# Patient Record
Sex: Male | Born: 1985 | Race: White | Hispanic: No | Marital: Single | Smoking: Never smoker
Health system: Southern US, Community
[De-identification: ages and names within clinical notes are randomized; demographics above are authoritative.]

## PROBLEM LIST (undated history)

## (undated) DIAGNOSIS — R7989 Other specified abnormal findings of blood chemistry: Secondary | ICD-10-CM

## (undated) DIAGNOSIS — E669 Obesity, unspecified: Secondary | ICD-10-CM

## (undated) DIAGNOSIS — I10 Essential (primary) hypertension: Secondary | ICD-10-CM

## (undated) DIAGNOSIS — W3400XA Accidental discharge from unspecified firearms or gun, initial encounter: Secondary | ICD-10-CM

## (undated) DIAGNOSIS — G4730 Sleep apnea, unspecified: Secondary | ICD-10-CM

## (undated) DIAGNOSIS — E7800 Pure hypercholesterolemia, unspecified: Secondary | ICD-10-CM

## (undated) HISTORY — PX: LEG SURGERY: SHX1003

## (undated) HISTORY — PX: FOOT SURGERY: SHX648

## (undated) HISTORY — DX: Obesity, unspecified: E66.9

## (undated) HISTORY — DX: Sleep apnea, unspecified: G47.30

## (undated) HISTORY — DX: Accidental discharge from unspecified firearms or gun, initial encounter: W34.00XA

## (undated) HISTORY — DX: Essential (primary) hypertension: I10

## (undated) HISTORY — DX: Pure hypercholesterolemia, unspecified: E78.00

## (undated) HISTORY — DX: Other specified abnormal findings of blood chemistry: R79.89

---
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
## 2005-07-16 ENCOUNTER — Ambulatory Visit: Payer: Self-pay | Admitting: Family Medicine

## 2005-12-11 ENCOUNTER — Emergency Department: Payer: Self-pay | Admitting: General Practice

## 2005-12-13 ENCOUNTER — Ambulatory Visit: Payer: Self-pay | Admitting: Unknown Physician Specialty

## 2007-11-03 ENCOUNTER — Emergency Department (HOSPITAL_COMMUNITY): Admission: EM | Admit: 2007-11-03 | Discharge: 2007-11-03 | Payer: Self-pay | Admitting: Emergency Medicine

## 2008-04-29 ENCOUNTER — Ambulatory Visit: Payer: Self-pay | Admitting: General Practice

## 2011-08-30 LAB — BASIC METABOLIC PANEL
BUN: 9
CO2: 29
Calcium: 9.5
Chloride: 101
Creatinine, Ser: 0.72
GFR calc Af Amer: >60
GFR calc non Af Amer: >60
Glucose, Bld: 106 — ABNORMAL HIGH
Potassium: 3.6
Sodium: 135

## 2011-08-30 LAB — CBC
HCT: 43.1
Hemoglobin: 14.9
MCHC: 34.6
MCV: 89.9
Platelets: 263
RBC: 4.79
RDW: 12.1
WBC: 12.7 — ABNORMAL HIGH

## 2011-08-30 LAB — DIFFERENTIAL
Basophils Absolute: 0
Basophils Relative: 0
Eosinophils Absolute: 0 — ABNORMAL LOW
Eosinophils Relative: 0
Lymphocytes Relative: 8 — ABNORMAL LOW
Lymphs Abs: 1.1
Monocytes Absolute: 0.5
Monocytes Relative: 4
Neutro Abs: 11.1 — ABNORMAL HIGH
Neutrophils Relative %: 88 — ABNORMAL HIGH

## 2013-07-07 ENCOUNTER — Emergency Department: Payer: Self-pay | Admitting: General Practice

## 2013-08-04 ENCOUNTER — Emergency Department: Payer: Self-pay | Admitting: Emergency Medicine

## 2013-10-26 ENCOUNTER — Emergency Department: Payer: Self-pay | Admitting: Emergency Medicine

## 2014-08-17 ENCOUNTER — Emergency Department: Payer: Self-pay | Admitting: Emergency Medicine

## 2014-08-30 ENCOUNTER — Ambulatory Visit: Payer: Self-pay | Admitting: General Practice

## 2017-03-12 ENCOUNTER — Emergency Department
Admission: EM | Admit: 2017-03-12 | Discharge: 2017-03-12 | Disposition: A | Discharge status: Home / Self Care | Payer: BLUE CROSS/BLUE SHIELD | Attending: Emergency Medicine | Admitting: Emergency Medicine

## 2017-03-12 ENCOUNTER — Encounter: Payer: Self-pay | Admitting: Medical Oncology

## 2017-03-12 DIAGNOSIS — J029 Acute pharyngitis, unspecified: Secondary | ICD-10-CM | POA: Insufficient documentation

## 2017-03-12 DIAGNOSIS — R69 Illness, unspecified: Secondary | ICD-10-CM

## 2017-03-12 DIAGNOSIS — R05 Cough: Secondary | ICD-10-CM | POA: Insufficient documentation

## 2017-03-12 DIAGNOSIS — J111 Influenza due to unidentified influenza virus with other respiratory manifestations: Secondary | ICD-10-CM

## 2017-03-12 MED ORDER — GUAIFENESIN-CODEINE 100-10 MG/5ML PO SYRP: 5.0000 mL | ORAL_SOLUTION | Freq: Three times a day (TID) | ORAL | 0 refills | Status: DC | PRN

## 2017-03-12 MED ORDER — OSELTAMIVIR PHOSPHATE 75 MG PO CAPS: 75.0000 mg | ORAL_CAPSULE | Freq: Two times a day (BID) | ORAL | 0 refills | Status: AC

## 2017-03-12 NOTE — ED Provider Notes (Signed)
[facility] Emergency Department Provider Note  ____________________________________________  Time seen: Approximately 7:45 PM  I have reviewed the triage vital signs and the nursing notes.   HISTORY  Chief Complaint Influenza   HPI William Simpson is a 31 y.o. male who presents to the emergency department for evaluation of flu-like symptoms that startedtoday. His father tested positive for influenza 4-5 days ago. Patient states he is here for "tamiflu." He has not taken any over the counter medications for his sore throat, cough and body aches.   History reviewed. No pertinent past medical history.  There are no active problems to display for this patient.   No past surgical history on file.  Prior to Admission medications   Medication Sig Start Date End Date Taking? Authorizing Provider  guaiFENesin-codeine (ROBITUSSIN AC) 100-10 MG/5ML syrup Take 5 mLs by mouth 3 (three) times daily as needed for cough. 03/12/17   Chinita Pester, FNP  oseltamivir (TAMIFLU) 75 MG capsule Take 1 capsule (75 mg total) by mouth 2 (two) times daily. 03/12/17 03/17/17  Chinita Pester, FNP    Allergies Sulfa antibiotics  No family history on file.  Social History Social History  Substance Use Topics  . Smoking status: Not on file  . Smokeless tobacco: Not on file  . Alcohol use Not on file    Review of Systems Constitutional: Negative for fever/ positive for chills ENT: Positive for sore throat. Cardiovascular: Denies chest pain. Respiratory: Negative for shortness of breath. Positive for cough. Gastrointestinal: Negative for nausea,  no vomiting.  No diarrhea.  Musculoskeletal: Positive for body aches Skin: Negative for rash. Neurological: Negative for headaches ____________________________________________   PHYSICAL EXAM:  VITAL SIGNS: ED Triage Vitals  Enc Vitals Group     BP 03/12/17 1826 135/80     Pulse Rate 03/12/17 1826 97     Resp  03/12/17 1826 20     Temp 03/12/17 1826 98.3 F (36.8 C)     Temp Source 03/12/17 1826 Oral     SpO2 03/12/17 1826 97 %     Weight 03/12/17 1825 280 lb (127 kg)     Height 03/12/17 1825  (1.854 m)     Head Circumference --      Peak Flow --      Pain Score 03/12/17 1825 2     Pain Loc --      Pain Edu? --      Excl. in GC? --     Constitutional: Alert and oriented. Acutely ill appearing and in no acute distress. Eyes: Conjunctivae are normal. EOMI. Ears: Bilateral TM normal Nose: Nasal congestion noted; clear rhinnorhea. Mouth/Throat: Mucous membranes are moist.  Oropharynx mildly erythematous. Tonsils without exudate. Neck: No stridor.  Lymphatic: No cervical lymphadenopathy. Cardiovascular: Normal rate, regular rhythm. Good peripheral circulation. Respiratory: Normal respiratory effort.  No retractions. Clear to auscultation throughout. . Gastrointestinal: Soft and nontender.  Musculoskeletal: FROM x 4 extremities.  Neurologic:  Normal speech and language.  Skin:  Skin is warm, dry and intact. No rash noted. Psychiatric: Mood and affect are normal. Speech and behavior are normal.  ____________________________________________   LABS (all labs ordered are listed, but only abnormal results are displayed)  Labs Reviewed - No data to display ____________________________________________  EKG  Not indicated. ____________________________________________  RADIOLOGY  Not indicated. ____________________________________________   PROCEDURES  Procedure(s) performed: None  Critical Care performed: No ____________________________________________   INITIAL IMPRESSION / ASSESSMENT AND PLAN / ED COURSE  31 year old  male presenting to the emergency department for treatment of influenza. Patient requests a prescription for Tamiflu. He will also be given a prescription for Robitussin-AC and advised to avoid contact with the public as much as possible for the next few  days. He is instructed to follow up with his primary care provider for symptoms that are not improving over the next few days. He was instructed to return to the emergency department for symptoms that change or worsen if he is unable to schedule an appointment.  Pertinent labs & imaging results that were available during my care of the patient were reviewed by me and considered in my medical decision making (see chart for details).  New Prescriptions   GUAIFENESIN-CODEINE (ROBITUSSIN AC) 100-10 MG/5ML SYRUP    Take 5 mLs by mouth 3 (three) times daily as needed for cough.   OSELTAMIVIR (TAMIFLU) 75 MG CAPSULE    Take 1 capsule (75 mg total) by mouth 2 (two) times daily.    If controlled substance prescribed during this visit, 12 month history viewed on the [facility] prior to issuing an initial prescription for Schedule II or III opiod. ____________________________________________   FINAL CLINICAL IMPRESSION(S) / ED DIAGNOSES  Final diagnoses:  Influenza-like illness    Note:  This document was prepared using Dragon voice recognition software and may include unintentional dictation errors.     Chinita Pester, FNP 03/12/17 2000    Sharman Cheek, MD 03/12/17 [phone]

## 2017-03-12 NOTE — ED Notes (Signed)
Pt states his father was diagnosed with "the flu" and pt states today he has begun to experience body aches. Pt states no fever. Pt states "last time I waited too long and they wouldn't give me theraflu." pt appears in no acute distress.

## 2017-03-12 NOTE — Discharge Instructions (Signed)
Follow up with the primary care provider for symptoms that are not improving over the next few days. ° °Return to the ER for symptoms that change or worsen if unable to schedule an appointment. °

## 2017-03-12 NOTE — ED Triage Notes (Signed)
Pts father was diagnosed with flu recently and pt today began having body aches and cough.

## 2017-04-04 ENCOUNTER — Ambulatory Visit
Admission: RE | Admit: 2017-04-04 | Discharge: 2017-04-04 | Disposition: A | Discharge status: Home / Self Care | Payer: BLUE CROSS/BLUE SHIELD | Source: Ambulatory Visit | Attending: Family Medicine | Admitting: Family Medicine

## 2017-04-04 ENCOUNTER — Other Ambulatory Visit: Payer: Self-pay | Admitting: Family Medicine

## 2017-04-04 DIAGNOSIS — R05 Cough: Secondary | ICD-10-CM | POA: Diagnosis not present

## 2017-04-04 DIAGNOSIS — R059 Cough, unspecified: Secondary | ICD-10-CM

## 2017-04-20 LAB — BASIC METABOLIC PANEL: Potassium: 4.4 (ref 3.4–5.3)

## 2017-04-20 LAB — LIPID PANEL
Cholesterol: 237 — AB (ref 0–200)
HDL: 60 (ref 35–70)
LDL Cholesterol: 152
Triglycerides: 127 (ref 40–160)

## 2017-04-20 LAB — HEPATIC FUNCTION PANEL
ALT: 93 — AB (ref 10–40)
AST: 44 — AB (ref 14–40)

## 2018-04-04 ENCOUNTER — Ambulatory Visit
Admission: RE | Admit: 2018-04-04 | Discharge: 2018-04-04 | Disposition: A | Discharge status: Home / Self Care | Payer: BLUE CROSS/BLUE SHIELD | Source: Ambulatory Visit | Attending: Family Medicine | Admitting: Family Medicine

## 2018-04-04 ENCOUNTER — Other Ambulatory Visit: Payer: Self-pay | Admitting: Family Medicine

## 2018-04-04 DIAGNOSIS — M79644 Pain in right finger(s): Secondary | ICD-10-CM | POA: Insufficient documentation

## 2018-04-04 DIAGNOSIS — X58XXXA Exposure to other specified factors, initial encounter: Secondary | ICD-10-CM | POA: Insufficient documentation

## 2018-04-04 DIAGNOSIS — S62521A Displaced fracture of distal phalanx of right thumb, initial encounter for closed fracture: Secondary | ICD-10-CM | POA: Insufficient documentation

## 2018-05-09 LAB — LIPID PANEL
Cholesterol: 223 — AB (ref 0–200)
HDL: 59 (ref 35–70)
LDL Cholesterol: 149
Triglycerides: 74 (ref 40–160)

## 2018-05-09 LAB — HEPATIC FUNCTION PANEL
ALT: 128 — AB (ref 10–40)
AST: 50 — AB (ref 14–40)

## 2018-05-09 LAB — BASIC METABOLIC PANEL: Glucose: 104

## 2018-11-29 ENCOUNTER — Emergency Department
Admission: EM | Admit: 2018-11-29 | Discharge: 2018-11-29 | Disposition: A | Discharge status: Home / Self Care | Payer: BLUE CROSS/BLUE SHIELD | Attending: Emergency Medicine | Admitting: Emergency Medicine

## 2018-11-29 ENCOUNTER — Encounter: Payer: Self-pay | Admitting: Emergency Medicine

## 2018-11-29 ENCOUNTER — Other Ambulatory Visit: Payer: Self-pay

## 2018-11-29 ENCOUNTER — Emergency Department: Payer: BLUE CROSS/BLUE SHIELD

## 2018-11-29 DIAGNOSIS — I10 Essential (primary) hypertension: Secondary | ICD-10-CM | POA: Diagnosis not present

## 2018-11-29 DIAGNOSIS — R079 Chest pain, unspecified: Secondary | ICD-10-CM

## 2018-11-29 LAB — BASIC METABOLIC PANEL
Anion gap: 8 (ref 5–15)
BUN: 7 mg/dL (ref 6–20)
CO2: 28 mmol/L (ref 22–32)
Calcium: 9.7 mg/dL (ref 8.9–10.3)
Chloride: 103 mmol/L (ref 98–111)
Creatinine, Ser: 0.81 mg/dL (ref 0.61–1.24)
GFR calc Af Amer: >60 mL/min (ref >60)
GLUCOSE: 97 mg/dL (ref 70–99)
POTASSIUM: 3.9 mmol/L (ref 3.5–5.1)
Sodium: 139 mmol/L (ref 135–145)

## 2018-11-29 LAB — CBC
HEMATOCRIT: 49.5 % (ref 39.0–52.0)
Hemoglobin: 17.1 g/dL — ABNORMAL HIGH (ref 13.0–17.0)
MCH: 33.3 pg (ref 26.0–34.0)
MCHC: 34.5 g/dL (ref 30.0–36.0)
MCV: 96.3 fL (ref 80.0–100.0)
Platelets: 224 10*3/uL (ref 150–400)
RBC: 5.14 MIL/uL (ref 4.22–5.81)
RDW: 12.4 % (ref 11.5–15.5)
WBC: 6.6 10*3/uL (ref 4.0–10.5)
nRBC: 0 % (ref 0.0–0.2)

## 2018-11-29 LAB — TROPONIN I

## 2018-11-29 NOTE — ED Triage Notes (Signed)
PT arrived with complaints of chest tightness that started 1 hour prior to arrival. Pt states he has had high blood pressure readings in the last 2 days but denies any diagnosis of HTN. Pt in NAD

## 2018-11-29 NOTE — ED Provider Notes (Addendum)
[facility] Emergency Department Provider Note  ____________________________________________   First MD Initiated Contact with Patient 11/29/18 1422     (approximate)  I have reviewed the triage vital signs and the nursing notes.   HISTORY  Chief Complaint Chest Pain   HPI William Simpson is a 33 y.o. male having chronic medical conditions he says that he has been having chest as well as facial pressure over the past several weeks.  He says that he had this sensation for approximately 8 hours yesterday from 1 PM until 9 PM.  States that at this time he is feeling improved.  However, an hour before arrival to the emergency department today said that he had the same pressure-like sensation and then went to his primary care doctor's office who found his blood pressure to be highly elevated in the 160s to 180s over 1 teens.  He says that he had also taken his blood pressure drugstore over the past 24 hours and it was elevated to a similar range as well.  Patient denies any shortness of breath, nausea or vomiting or abnormal diaphoresis.  He says that he is a "sweaty person."  Says that he has a family history of high blood pressure but denies any family history of heart disease.  Says that he drinks about 4-5 drinks a day of beer and liquor and that he has lost weight over the past several months.  Says that the symptoms do not worsen with exertion.  Denies any recent stressful events.    History reviewed. No pertinent past medical history.  There are no active problems to display for this patient.   History reviewed. No pertinent surgical history.  Prior to Admission medications   Medication Sig Start Date End Date Taking? Authorizing Provider  guaiFENesin-codeine (ROBITUSSIN AC) 100-10 MG/5ML syrup Take 5 mLs by mouth 3 (three) times daily as needed for cough. 03/12/17   Triplett, Rulon Eisenmenger B, FNP    Allergies Amoxicillin and Sulfa antibiotics  No family  history on file.  Social History Social History   Tobacco Use  . Smoking status: Never Smoker  . Smokeless tobacco: Current User  Substance Use Topics  . Alcohol use: Yes  . Drug use: Not on file    Review of Systems  Constitutional: No fever/chills Eyes: No visual changes. ENT: No sore throat. Cardiovascular: Denies "chest pain."  However, does report "chest pressure." Respiratory: Denies shortness of breath. Gastrointestinal: No abdominal pain.  No nausea, no vomiting.  No diarrhea.  No constipation. Genitourinary: Negative for dysuria. Musculoskeletal: Negative for back pain. Skin: Negative for rash. Neurological: Negative for headaches, focal weakness or numbness.   ____________________________________________   PHYSICAL EXAM:  VITAL SIGNS: ED Triage Vitals  Enc Vitals Group     BP 11/29/18 1351 (!) 142/89     Pulse Rate 11/29/18 1351 81     Resp 11/29/18 1351 18     Temp 11/29/18 1351 98.1 F (36.7 C)     Temp Source 11/29/18 1351 Oral     SpO2 11/29/18 1351 96 %     Weight 11/29/18 1354 280 lb (127 kg)     Height 11/29/18 1354 6\' 1"  (1.854 m)     Head Circumference --      Peak Flow --      Pain Score 11/29/18 1354 3     Pain Loc --      Pain Edu? --      Excl. in GC? --  Constitutional: Alert and oriented. Well appearing and in no acute distress. Eyes: Conjunctivae are normal.  Head: Atraumatic. Nose: No congestion/rhinnorhea. Mouth/Throat: Mucous membranes are moist.  Neck: No stridor.   Cardiovascular: Normal rate, regular rhythm. Grossly normal heart sounds.   Respiratory: Normal respiratory effort.  No retractions. Lungs CTAB. Gastrointestinal: Soft and nontender. No distention. Musculoskeletal: No lower extremity tenderness nor edema.  No joint effusions. Neurologic:  Normal speech and language. No gross focal neurologic deficits are appreciated. Skin:  Skin is warm, dry and intact. No rash noted. Psychiatric: Mood and affect are normal.  Speech and behavior are normal.  ____________________________________________   LABS (all labs ordered are listed, but only abnormal results are displayed)  Labs Reviewed  CBC - Abnormal; Notable for the following components:      Result Value   Hemoglobin 17.1 (*)    All other components within normal limits  BASIC METABOLIC PANEL  TROPONIN I   ____________________________________________  EKG  ED ECG REPORT I, Arelia Longest, the attending physician, personally viewed and interpreted this ECG.   Date: 11/29/2018  EKG Time: 1348  Rate: 80  Rhythm: normal sinus rhythm  Axis: Normal  Intervals:none  ST&T Change: No ST segment elevation or depression.  No abnormal T wave inversion.  ____________________________________________  RADIOLOGY  Patient refuses chest x-ray ____________________________________________   PROCEDURES  Procedure(s) performed:   Procedures  Critical Care performed:   ____________________________________________   INITIAL IMPRESSION / ASSESSMENT AND PLAN / ED COURSE  Pertinent labs & imaging results that were available during my care of the patient were reviewed by me and considered in my medical decision making (see chart for details).  Differential diagnosis includes, but is not limited to, ACS, aortic dissection, pulmonary embolism, cardiac tamponade, pneumothorax, pneumonia, pericarditis, myocarditis, GI-related causes including esophagitis/gastritis, and musculoskeletal chest wall pain.   As part of my medical decision making, I reviewed the following data within the electronic MEDICAL RECORD NUMBER Notes from prior ED visits  Heart score of 1.  Patient will be discharged at this time.  Will refer to cardiology.  He says that he will be returned to his primary care doctors of the next 2 days for blood pressure checks.  Blood pressure trending downward with pressure now in the 140s over 60s.  Patient asymptomatic.  Will be discharged.  He  is understanding the diagnosis well treatment and willing to comply but we also discussed reducing his alcohol intake as well as continuing to lose weight as this will be helpful for his overall health.  I will not be starting an antihypertensive meds at this time as the patient says that his blood pressure is only been elevated over the past 24 hours and then normally he has 120s over 80s.  Appears to be trending downward back towards normal at this time.  He will be following up for further blood pressure checks with his primary care doctor.  ____________________________________________   FINAL CLINICAL IMPRESSION(S) / ED DIAGNOSES  Chest pain.  Hypertension.   NEW MEDICATIONS STARTED DURING THIS VISIT:  New Prescriptions   No medications on file     Note:  This document was prepared using Dragon voice recognition software and may include unintentional dictation errors.     Myrna Blazer, MD 11/29/18 1547    Pershing Proud Myra Rude, MD 11/29/18 [phone]

## 2019-04-07 IMAGING — CR DG CHEST 2V
1 series · 2 of 2 positions shown · non-contrast
Comparison: October 27, 2013

CLINICAL DATA: Cough and congestion for 2 months. Fever for 3 days.

EXAM:
CHEST  2 VIEW

[Series 1: dg chest 2 view · 0.14mm/px · 2 of 2 slices shown]
[im 1/2]
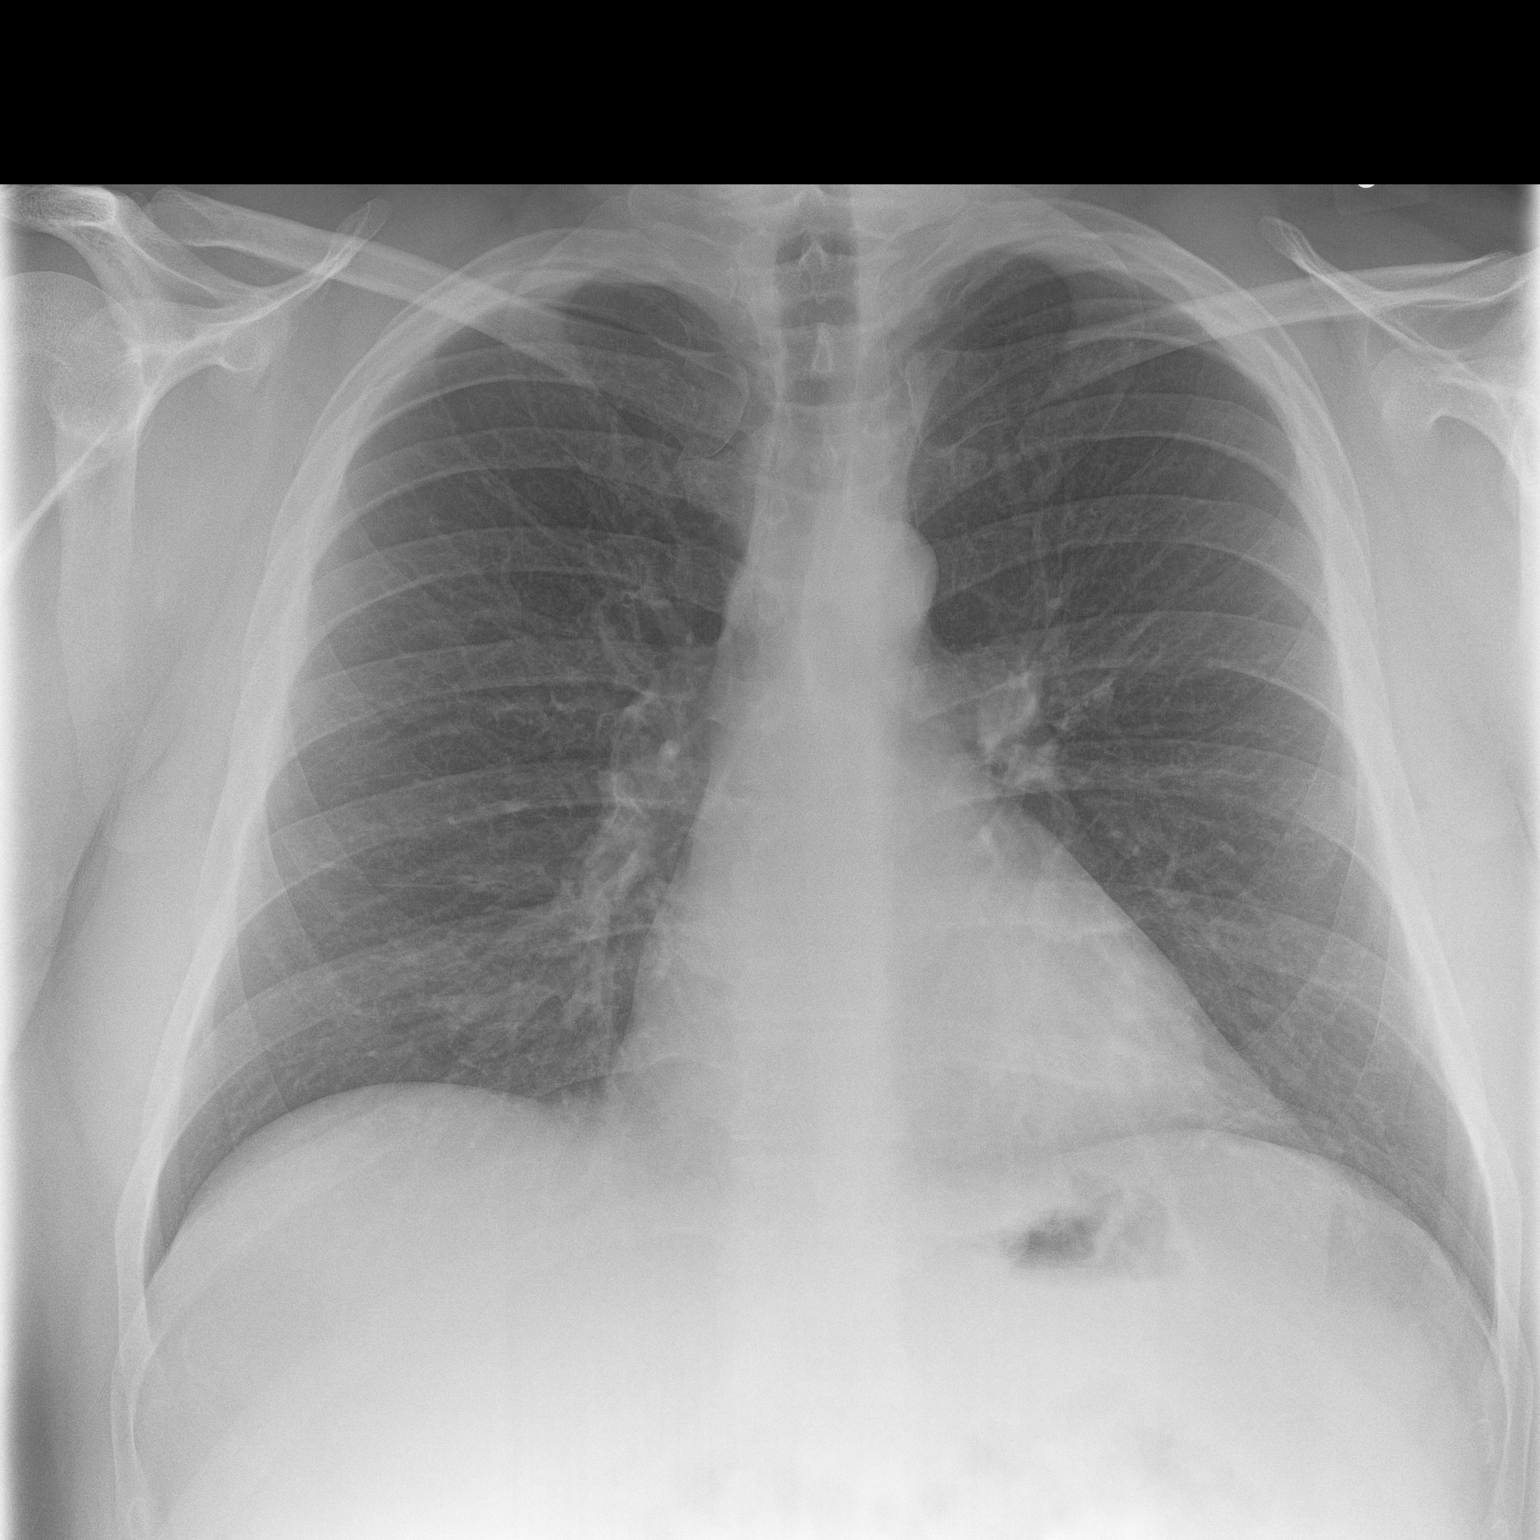
[im 2/2]
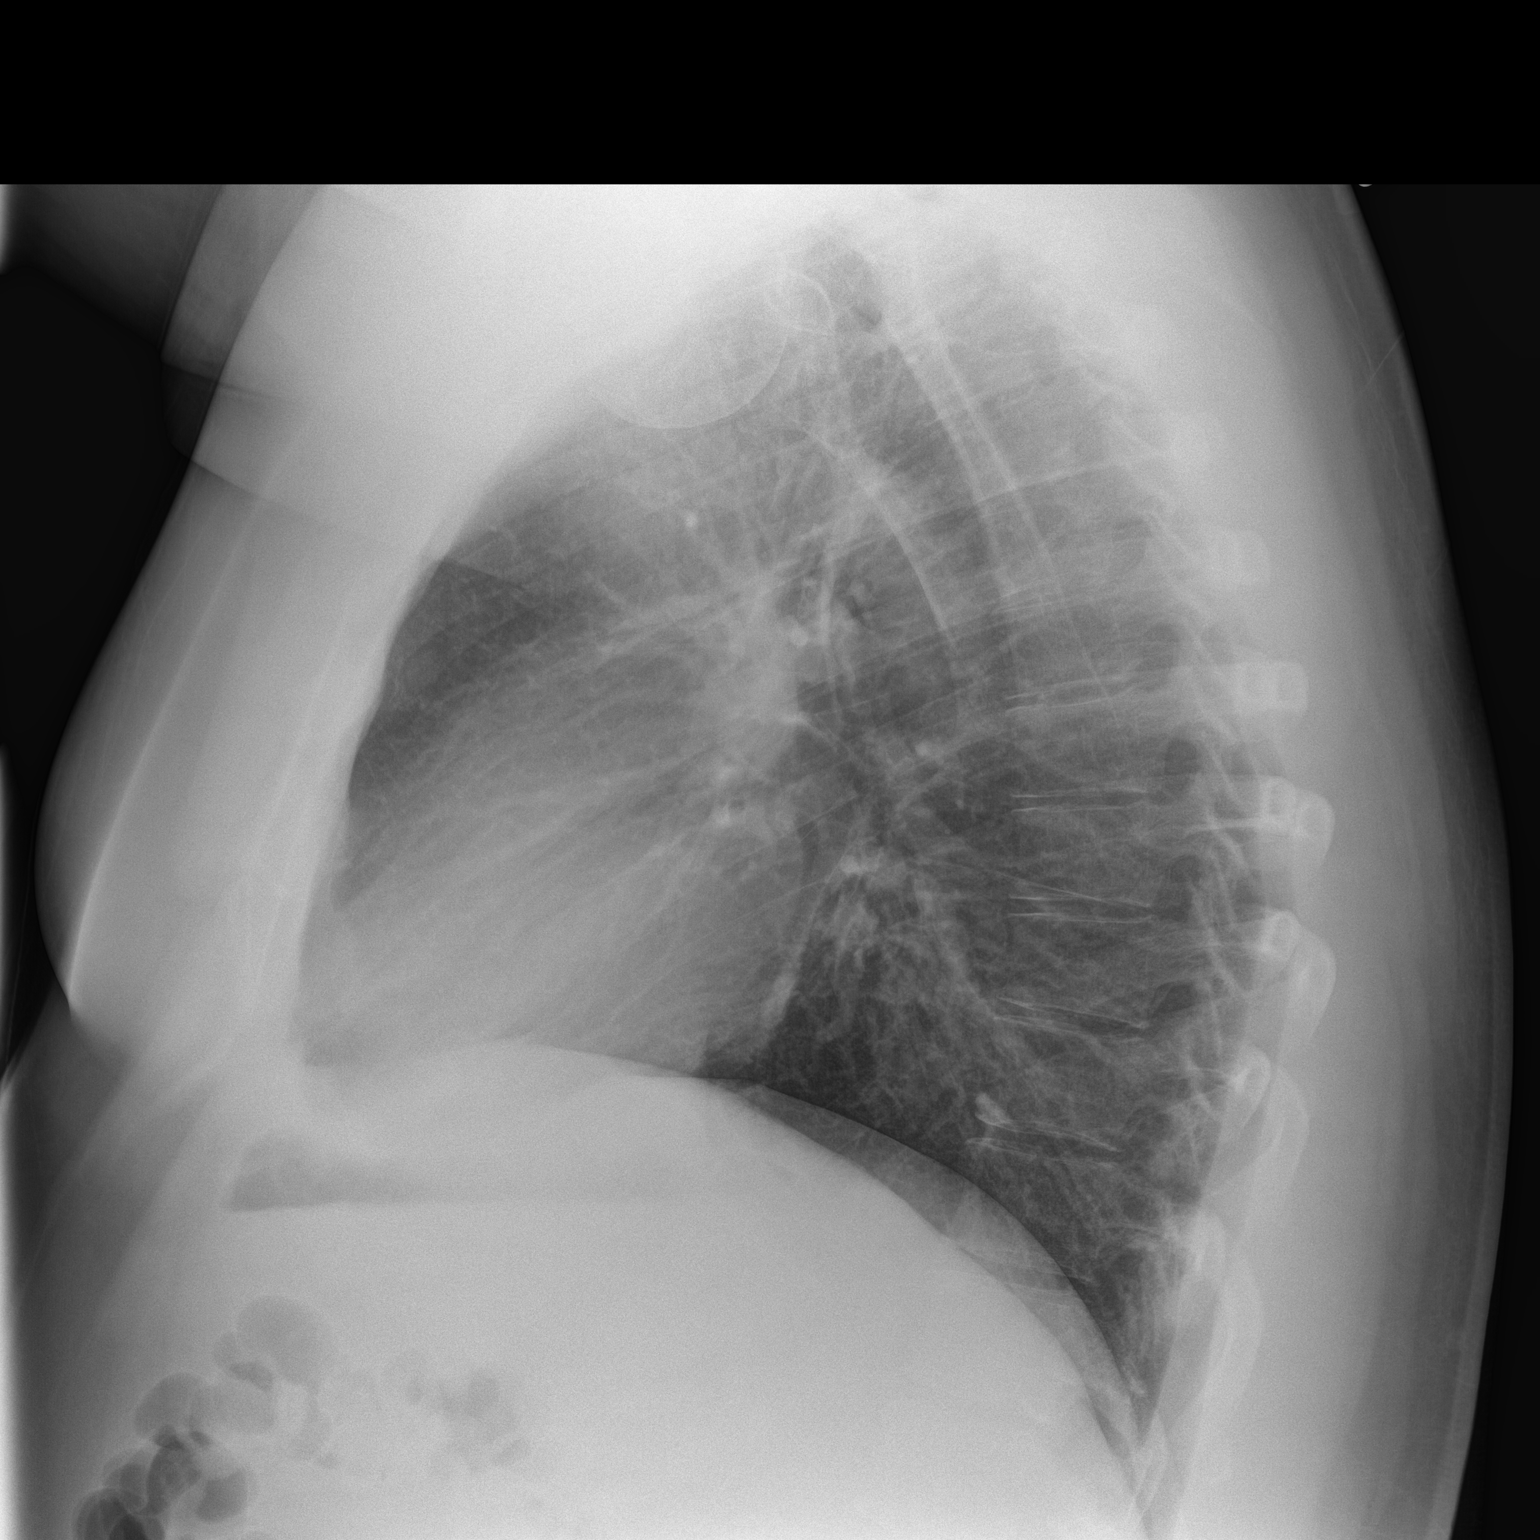

[2 of 2 positions shown; findings below may reference images not displayed]

FINDINGS: Lungs are clear. Heart size and pulmonary vascularity are normal. No
adenopathy. No bone lesions.
IMPRESSION: No edema or consolidation.

## 2019-05-10 ENCOUNTER — Telehealth: Payer: Self-pay

## 2019-05-10 NOTE — Telephone Encounter (Signed)
Spoke with pt advised needs to be seen and is due for physical he wants to be seen for the shoulder and back pain to discuss getting a rx for a massage. Ov made

## 2019-05-10 NOTE — Telephone Encounter (Signed)
Please let patient know I cannot write that for him as we have not seen him for any such diagnosis recently, with last visit last June.  Thanks,  [facility]

## 2019-05-11 DIAGNOSIS — Z720 Tobacco use: Secondary | ICD-10-CM | POA: Insufficient documentation

## 2019-05-11 LAB — ALKALINE PHOSPHATASE
Alkaline Phosphatase: 35
Alkaline Phosphatase: 36

## 2019-05-11 LAB — BUN/CREATININE RATIO: BUN/Creatinine Ratio: 7

## 2019-05-14 ENCOUNTER — Ambulatory Visit: Payer: Self-pay | Admitting: Internal Medicine

## 2019-05-14 ENCOUNTER — Encounter: Payer: Self-pay | Admitting: Internal Medicine

## 2019-05-14 ENCOUNTER — Other Ambulatory Visit: Payer: Self-pay

## 2019-05-14 VITALS — BP 148/100 | HR 96 | Temp 98.0°F | Resp 14 | Ht 73.0 in | Wt 297.0 lb

## 2019-05-14 DIAGNOSIS — M7541 Impingement syndrome of right shoulder: Secondary | ICD-10-CM

## 2019-05-14 DIAGNOSIS — E6609 Other obesity due to excess calories: Secondary | ICD-10-CM

## 2019-05-14 DIAGNOSIS — M25511 Pain in right shoulder: Secondary | ICD-10-CM

## 2019-05-14 DIAGNOSIS — S46011A Strain of muscle(s) and tendon(s) of the rotator cuff of right shoulder, initial encounter: Secondary | ICD-10-CM

## 2019-05-14 DIAGNOSIS — Z6839 Body mass index (BMI) 39.0-39.9, adult: Secondary | ICD-10-CM

## 2019-05-14 DIAGNOSIS — R030 Elevated blood-pressure reading, without diagnosis of hypertension: Secondary | ICD-10-CM

## 2019-05-14 NOTE — Progress Notes (Signed)
S - Presents as requested a script for massage therapy for back and shoulder issues as he needs one for insurance purposes. He has a prepaid flex card and that will be covered if has a script. He obtained one in the past and noted was for stress relief.  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 No one time trauma could recall that caused pain. No h/o shoulder injuries Not ice, did apply heat some Tried OTC ibuprofen sparingly Has not limited him with work, is in charge of an investigative unit, and thought would get checked while here today His wife is a PT  Denies any h/o back pain concerns  His BP has been high in the past intermittently, never been on meds to manage, can get checked at home as has a cuff to do so and has been checking periodically at home in past weeks and noted has not been elevated (DBP <90).   No tob history  O - NAD, obese, masked  BP (!) 148/100 (BP Location: Right Arm, Patient Position: Sitting, Cuff Size: Large)   Pulse 96   Temp 98 F (36.7 C) (Oral)   Resp 14   Ht 6\' 1"  (1.854 m)   Wt 297 lb (134.7 kg)   SpO2 97%   BMI 39.18 kg/m    Sclera anicteric R Shoulder - ROM testing - abduction limited above shoulder level due to pain and could get to full  abduction  No painful arc  Forward elevation not limited and much easier than abduction  Apley's negative  NT with palpation at A-C joint  NT anterior joint line, NT posterior joint line  Tender at subacromial bursa region  Apprehension sign neg  Impingement test pos  RC testing - supraspinatus  with slightly less strength right vs left, and no minimal pain  testing    Lift-off test with good strength, +  Pain with testing    Internal rotation with good strength and no pain    External rotation with good strength and mild pain with testing  NT bicipital tendon  anterior  Sensation intact deltoid and in UE diffusely to LT  Good UE strength including good grip strength  No radicular signs in UE Good shoulder shrug Neck - FROM, no pain with ROM testing       Ass/Plan  1. RC strain/+ impingement - R shoulder  Ice liberally short term and not heat   Can use ibuprofen - up to 600mg  4X/day prn with food   Not feel x-ray helpful presently and explained why and discussed PT and RC strengthening program and he noted his wife is a PT and she can help with that  Stay active below shoulder level, not above and minimize any heavy lifting until much improved   2. Increased BP - concern not as well controlled in recent past, did note gets checked at times and has been reasonable   Will check BP's about every other day and record and f/u in 3-4 weeks to review.   Weight may be a factor with BP as well  3. Obesity - BMI almost 40, weight control/loss will be important to help with BP control over time as well  4. Massage request as above - explained not for shoulder, and he does not really have a defined medical indication for massage therapy. I wrote  on a prescription massage therapy without a diagnosis as massage can be helpful in my opinion at times related to stress issues, and muscle strains/spasms.

## 2019-05-23 ENCOUNTER — Telehealth: Payer: Self-pay

## 2019-05-23 DIAGNOSIS — Z03818 Encounter for observation for suspected exposure to other biological agents ruled out: Secondary | ICD-10-CM | POA: Diagnosis not present

## 2019-05-23 NOTE — Telephone Encounter (Signed)
Pt aware and work note emailed to [employer] at [employer]. Pt picking up order for [facility] clinic covid testing

## 2019-05-23 NOTE — Telephone Encounter (Signed)
  1. Do you have a fever? no 2. Are you having chills? no 3. Do you have a sore throat? yes 4. Are you experiencing resp S/Sx -cough, SOB? Yes Cough started yesterday 5. Do you have muscle aches? no 6. Are you experiencing N/V/D? no 7. Experiencing loss of sense of taste and/or smell? no 8. Do you have a headache? no 9. Have you had contact with a person confirmed Positive for Covid-19? no 10. Have you traveled outside of [place]? No  Cough started yesterday now sore throat, no other sx. Works Youth worker at work now, isolated in an office at [place]. Lives with wife and kids none have symptoms. Not taking any medications, fever reducers.

## 2019-05-23 NOTE — Telephone Encounter (Signed)
Please have him go to [facility] for testing, and go home. Stay out of work as Designer, television/film set and treat symptomatically at present. Will touch base by phone tomorrow as well before the holiday weekend. Stay isolated from those at home as well rec'ed.

## 2019-05-28 NOTE — Telephone Encounter (Signed)
Pt states test came back neg on 05/25/2019. Last ran fever on 05/24/2019, no fever reducers and back at work today. Still does have a cough pt relating to allergies.

## 2019-05-28 NOTE — Telephone Encounter (Signed)
Thanks for the followup!  

## 2019-07-13 DIAGNOSIS — Z20828 Contact with and (suspected) exposure to other viral communicable diseases: Secondary | ICD-10-CM | POA: Diagnosis not present

## 2019-09-30 DIAGNOSIS — Z20828 Contact with and (suspected) exposure to other viral communicable diseases: Secondary | ICD-10-CM | POA: Diagnosis not present

## 2019-12-14 DIAGNOSIS — Z20828 Contact with and (suspected) exposure to other viral communicable diseases: Secondary | ICD-10-CM | POA: Diagnosis not present

## 2020-01-31 DIAGNOSIS — G4733 Obstructive sleep apnea (adult) (pediatric): Secondary | ICD-10-CM | POA: Diagnosis not present

## 2020-01-31 DIAGNOSIS — I10 Essential (primary) hypertension: Secondary | ICD-10-CM | POA: Diagnosis not present

## 2020-01-31 DIAGNOSIS — R55 Syncope and collapse: Secondary | ICD-10-CM | POA: Diagnosis not present

## 2020-02-06 DIAGNOSIS — G4733 Obstructive sleep apnea (adult) (pediatric): Secondary | ICD-10-CM | POA: Insufficient documentation

## 2020-02-06 DIAGNOSIS — R55 Syncope and collapse: Secondary | ICD-10-CM | POA: Insufficient documentation

## 2020-02-06 DIAGNOSIS — I10 Essential (primary) hypertension: Secondary | ICD-10-CM | POA: Insufficient documentation

## 2020-04-06 IMAGING — CR DG FINGER THUMB 2+V*R*
3 series · 3 of 3 positions shown · non-contrast
Comparison: None.

CLINICAL DATA: Thumb pain after crush injury

EXAM:
RIGHT THUMB 2+V

[finger ap]
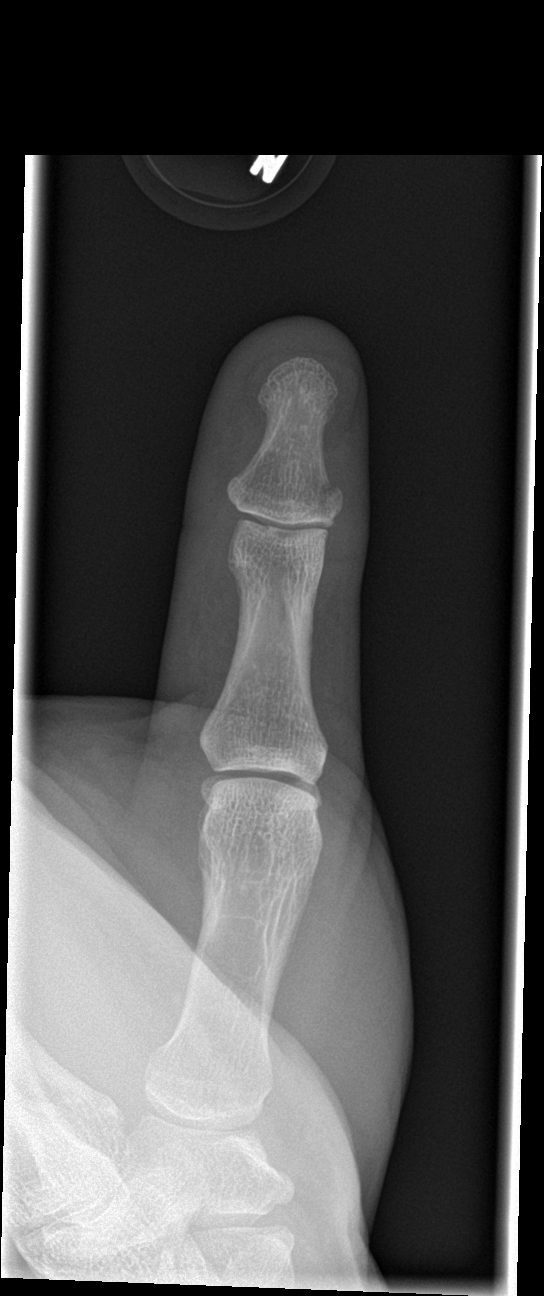

[finger obl]
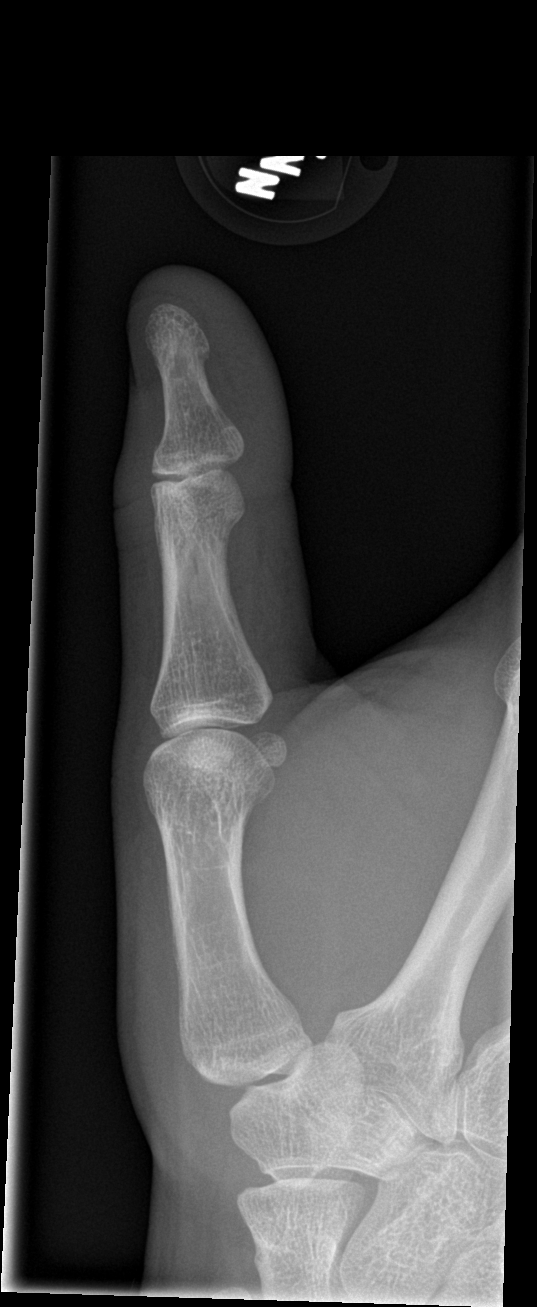

[finger lat]
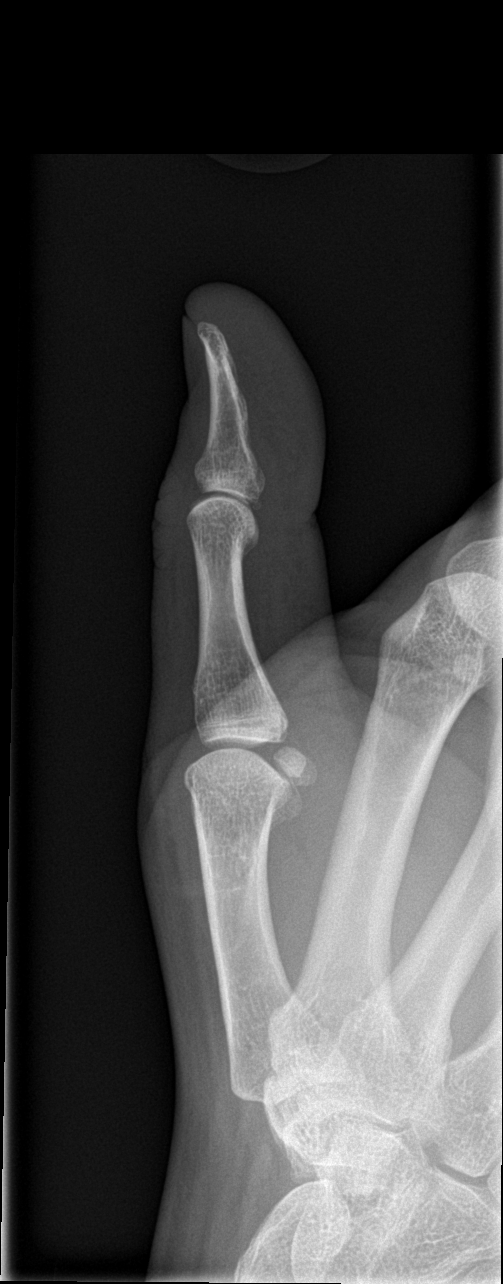

[3 of 3 positions shown; findings below may reference images not displayed]

FINDINGS: Acute to subacute appearing minimally displaced fracture base of the
first distal phalanx, volar aspect likely with articular extension.
No subluxation.
IMPRESSION: Acute to subacute minimally displaced fracture base of the first
distal phalanx

## 2020-04-09 DIAGNOSIS — Q6689 Other  specified congenital deformities of feet: Secondary | ICD-10-CM | POA: Diagnosis not present

## 2020-04-09 DIAGNOSIS — M2141 Flat foot [pes planus] (acquired), right foot: Secondary | ICD-10-CM | POA: Diagnosis not present

## 2020-04-09 DIAGNOSIS — M19071 Primary osteoarthritis, right ankle and foot: Secondary | ICD-10-CM | POA: Diagnosis not present

## 2020-04-09 DIAGNOSIS — M25571 Pain in right ankle and joints of right foot: Secondary | ICD-10-CM | POA: Diagnosis not present

## 2020-04-23 DIAGNOSIS — M19071 Primary osteoarthritis, right ankle and foot: Secondary | ICD-10-CM | POA: Diagnosis not present

## 2020-04-23 DIAGNOSIS — Q6689 Other  specified congenital deformities of feet: Secondary | ICD-10-CM | POA: Diagnosis not present

## 2020-04-23 DIAGNOSIS — M216X1 Other acquired deformities of right foot: Secondary | ICD-10-CM | POA: Diagnosis not present

## 2020-04-23 DIAGNOSIS — M2141 Flat foot [pes planus] (acquired), right foot: Secondary | ICD-10-CM | POA: Diagnosis not present

## 2020-04-23 DIAGNOSIS — M2140 Flat foot [pes planus] (acquired), unspecified foot: Secondary | ICD-10-CM | POA: Insufficient documentation

## 2020-04-26 DIAGNOSIS — Z20822 Contact with and (suspected) exposure to covid-19: Secondary | ICD-10-CM | POA: Diagnosis not present

## 2020-04-26 DIAGNOSIS — Z03818 Encounter for observation for suspected exposure to other biological agents ruled out: Secondary | ICD-10-CM | POA: Diagnosis not present

## 2020-06-12 DIAGNOSIS — M216X1 Other acquired deformities of right foot: Secondary | ICD-10-CM | POA: Diagnosis not present

## 2020-06-12 DIAGNOSIS — M19071 Primary osteoarthritis, right ankle and foot: Secondary | ICD-10-CM | POA: Diagnosis not present

## 2020-06-25 DIAGNOSIS — Q6689 Other  specified congenital deformities of feet: Secondary | ICD-10-CM | POA: Diagnosis not present

## 2020-06-25 DIAGNOSIS — M19071 Primary osteoarthritis, right ankle and foot: Secondary | ICD-10-CM | POA: Diagnosis not present

## 2020-07-04 ENCOUNTER — Other Ambulatory Visit: Payer: Self-pay

## 2020-07-04 DIAGNOSIS — Z0000 Encounter for general adult medical examination without abnormal findings: Secondary | ICD-10-CM

## 2020-07-05 LAB — CMP12+LP+TP+TSH+6AC+CBC/D/PLT
ALT: 79 IU/L — ABNORMAL HIGH (ref 0–44)
AST: 46 IU/L — ABNORMAL HIGH (ref 0–40)
Albumin/Globulin Ratio: 1.6 (ref 1.2–2.2)
Albumin: 4.6 g/dL (ref 4.0–5.0)
Alkaline Phosphatase: 45 IU/L — ABNORMAL LOW (ref 48–121)
BUN/Creatinine Ratio: 7 — ABNORMAL LOW (ref 9–20)
BUN: 6 mg/dL (ref 6–20)
Basophils Absolute: 0.1 10*3/uL (ref 0.0–0.2)
Basos: 1 %
Bilirubin Total: 0.7 mg/dL (ref 0.0–1.2)
Calcium: 10 mg/dL (ref 8.7–10.2)
Chloride: 101 mmol/L (ref 96–106)
Chol/HDL Ratio: 4 ratio (ref 0.0–5.0)
Cholesterol, Total: 215 mg/dL — ABNORMAL HIGH (ref 100–199)
Creatinine, Ser: 0.85 mg/dL (ref 0.76–1.27)
EOS (ABSOLUTE): 0.2 10*3/uL (ref 0.0–0.4)
Eos: 3 %
Estimated CHD Risk: 0.7 times avg. (ref 0.0–1.0)
Free Thyroxine Index: 2.2 (ref 1.2–4.9)
GFR calc Af Amer: 130 mL/min/{1.73_m2} (ref >59)
GFR calc non Af Amer: 112 mL/min/{1.73_m2} (ref >59)
GGT: 80 IU/L — ABNORMAL HIGH (ref 0–65)
Globulin, Total: 2.9 g/dL (ref 1.5–4.5)
Glucose: 99 mg/dL (ref 65–99)
HDL: 54 mg/dL (ref >39)
Hematocrit: 49.7 % (ref 37.5–51.0)
Hemoglobin: 16.9 g/dL (ref 13.0–17.7)
Immature Grans (Abs): 0 10*3/uL (ref 0.0–0.1)
Immature Granulocytes: 1 %
Iron: 163 ug/dL (ref 38–169)
LDH: 228 IU/L — ABNORMAL HIGH (ref 121–224)
LDL Chol Calc (NIH): 135 mg/dL — ABNORMAL HIGH (ref 0–99)
Lymphocytes Absolute: 2.1 10*3/uL (ref 0.7–3.1)
Lymphs: 33 %
MCH: 33.3 pg — ABNORMAL HIGH (ref 26.6–33.0)
MCHC: 34 g/dL (ref 31.5–35.7)
MCV: 98 fL — ABNORMAL HIGH (ref 79–97)
Monocytes Absolute: 0.5 10*3/uL (ref 0.1–0.9)
Monocytes: 8 %
Neutrophils Absolute: 3.6 10*3/uL (ref 1.4–7.0)
Neutrophils: 54 %
Phosphorus: 3.9 mg/dL (ref 2.8–4.1)
Platelets: 244 10*3/uL (ref 150–450)
Potassium: 4.2 mmol/L (ref 3.5–5.2)
RBC: 5.07 x10E6/uL (ref 4.14–5.80)
RDW: 12.6 % (ref 11.6–15.4)
Sodium: 140 mmol/L (ref 134–144)
T3 Uptake Ratio: 32 % (ref 24–39)
T4, Total: 7 ug/dL (ref 4.5–12.0)
TSH: 2.53 u[IU]/mL (ref 0.450–4.500)
Total Protein: 7.5 g/dL (ref 6.0–8.5)
Triglycerides: 147 mg/dL (ref 0–149)
Uric Acid: 6.9 mg/dL (ref 3.8–8.4)
VLDL Cholesterol Cal: 26 mg/dL (ref 5–40)
WBC: 6.5 10*3/uL (ref 3.4–10.8)

## 2020-07-06 DIAGNOSIS — G4733 Obstructive sleep apnea (adult) (pediatric): Secondary | ICD-10-CM | POA: Diagnosis not present

## 2020-07-10 ENCOUNTER — Encounter: Payer: Self-pay | Admitting: Emergency Medicine

## 2020-07-10 ENCOUNTER — Ambulatory Visit: Payer: Self-pay | Admitting: Emergency Medicine

## 2020-07-10 ENCOUNTER — Other Ambulatory Visit: Payer: Self-pay

## 2020-07-10 VITALS — BP 146/88 | HR 98 | Temp 98.7°F | Resp 16 | Ht 73.0 in | Wt 278.0 lb

## 2020-07-10 DIAGNOSIS — Z0000 Encounter for general adult medical examination without abnormal findings: Secondary | ICD-10-CM

## 2020-07-10 LAB — POCT URINALYSIS DIPSTICK
Bilirubin, UA: NEGATIVE
Blood, UA: NEGATIVE
Glucose, UA: NEGATIVE
Ketones, UA: NEGATIVE
Leukocytes, UA: NEGATIVE
Nitrite, UA: NEGATIVE
Protein, UA: NEGATIVE
Spec Grav, UA: >=1.03 — AB (ref 1.010–1.025)
Urobilinogen, UA: 0.2 E.U./dL
pH, UA: 6 (ref 5.0–8.0)

## 2020-07-10 NOTE — Addendum Note (Signed)
Addended by: Christianne Dolin F on: 07/10/2020 04:45 PM   Modules accepted: Orders
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
## 2020-07-16 DIAGNOSIS — G4733 Obstructive sleep apnea (adult) (pediatric): Secondary | ICD-10-CM | POA: Diagnosis not present

## 2020-07-16 DIAGNOSIS — R55 Syncope and collapse: Secondary | ICD-10-CM | POA: Diagnosis not present

## 2020-07-23 DIAGNOSIS — Q6689 Other  specified congenital deformities of feet: Secondary | ICD-10-CM | POA: Diagnosis not present

## 2020-07-23 DIAGNOSIS — M19071 Primary osteoarthritis, right ankle and foot: Secondary | ICD-10-CM | POA: Diagnosis not present

## 2020-08-20 ENCOUNTER — Other Ambulatory Visit: Payer: Self-pay

## 2020-08-20 ENCOUNTER — Ambulatory Visit: Payer: 59 | Attending: Medical

## 2020-08-20 DIAGNOSIS — M25571 Pain in right ankle and joints of right foot: Secondary | ICD-10-CM | POA: Insufficient documentation

## 2020-08-20 DIAGNOSIS — M25671 Stiffness of right ankle, not elsewhere classified: Secondary | ICD-10-CM | POA: Diagnosis not present
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
## 2020-08-28 ENCOUNTER — Ambulatory Visit: Payer: 59

## 2020-09-02 ENCOUNTER — Other Ambulatory Visit: Payer: Self-pay

## 2020-09-02 ENCOUNTER — Ambulatory Visit: Payer: 59 | Attending: Medical

## 2020-09-02 DIAGNOSIS — M25671 Stiffness of right ankle, not elsewhere classified: Secondary | ICD-10-CM | POA: Diagnosis not present

## 2020-09-02 DIAGNOSIS — M25571 Pain in right ankle and joints of right foot: Secondary | ICD-10-CM | POA: Diagnosis not present
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
## 2020-09-04 ENCOUNTER — Ambulatory Visit: Payer: 59

## 2020-09-04 ENCOUNTER — Other Ambulatory Visit: Payer: Self-pay

## 2020-09-04 DIAGNOSIS — M25571 Pain in right ankle and joints of right foot: Secondary | ICD-10-CM | POA: Diagnosis not present

## 2020-09-04 DIAGNOSIS — M25671 Stiffness of right ankle, not elsewhere classified: Secondary | ICD-10-CM | POA: Diagnosis not present
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
## 2020-09-08 ENCOUNTER — Ambulatory Visit: Payer: 59

## 2020-09-08 ENCOUNTER — Other Ambulatory Visit: Payer: Self-pay

## 2020-09-08 DIAGNOSIS — M25571 Pain in right ankle and joints of right foot: Secondary | ICD-10-CM | POA: Diagnosis not present

## 2020-09-08 DIAGNOSIS — M25671 Stiffness of right ankle, not elsewhere classified: Secondary | ICD-10-CM | POA: Diagnosis not present
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
## 2020-09-11 ENCOUNTER — Other Ambulatory Visit: Payer: Self-pay

## 2020-09-11 ENCOUNTER — Ambulatory Visit: Payer: 59

## 2020-09-11 DIAGNOSIS — M25671 Stiffness of right ankle, not elsewhere classified: Secondary | ICD-10-CM

## 2020-09-11 DIAGNOSIS — M25571 Pain in right ankle and joints of right foot: Secondary | ICD-10-CM
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
## 2020-09-17 ENCOUNTER — Ambulatory Visit: Payer: 59

## 2020-09-17 ENCOUNTER — Other Ambulatory Visit: Payer: Self-pay

## 2020-09-17 DIAGNOSIS — M25571 Pain in right ankle and joints of right foot: Secondary | ICD-10-CM | POA: Diagnosis not present

## 2020-09-17 DIAGNOSIS — M25671 Stiffness of right ankle, not elsewhere classified: Secondary | ICD-10-CM
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
## 2020-10-01 ENCOUNTER — Ambulatory Visit: Payer: 59

## 2020-10-06 ENCOUNTER — Other Ambulatory Visit: Payer: Self-pay

## 2020-10-06 ENCOUNTER — Ambulatory Visit: Payer: 59 | Attending: Medical

## 2020-10-06 DIAGNOSIS — M25571 Pain in right ankle and joints of right foot: Secondary | ICD-10-CM | POA: Insufficient documentation

## 2020-10-06 DIAGNOSIS — M25671 Stiffness of right ankle, not elsewhere classified: Secondary | ICD-10-CM | POA: Diagnosis not present
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
## 2020-10-07 NOTE — Addendum Note (Signed)
Addended by: Bethanie Dicker on: 10/07/2020 07:57 AM   Modules accepted: Orders

## 2020-10-09 ENCOUNTER — Ambulatory Visit: Payer: 59

## 2020-10-09 ENCOUNTER — Other Ambulatory Visit: Payer: Self-pay

## 2020-10-09 DIAGNOSIS — M25671 Stiffness of right ankle, not elsewhere classified: Secondary | ICD-10-CM | POA: Diagnosis not present

## 2020-10-09 DIAGNOSIS — M25571 Pain in right ankle and joints of right foot: Secondary | ICD-10-CM
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
## 2020-10-13 ENCOUNTER — Ambulatory Visit: Payer: 59

## 2020-10-15 ENCOUNTER — Other Ambulatory Visit: Payer: Self-pay

## 2020-10-15 ENCOUNTER — Ambulatory Visit: Payer: 59

## 2020-10-15 DIAGNOSIS — M25571 Pain in right ankle and joints of right foot: Secondary | ICD-10-CM | POA: Diagnosis not present

## 2020-10-15 DIAGNOSIS — M25671 Stiffness of right ankle, not elsewhere classified: Secondary | ICD-10-CM | POA: Diagnosis not present
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
## 2020-10-20 ENCOUNTER — Ambulatory Visit: Payer: 59

## 2020-10-21 ENCOUNTER — Ambulatory Visit: Payer: 59

## 2020-10-21 ENCOUNTER — Other Ambulatory Visit: Payer: Self-pay

## 2020-10-21 DIAGNOSIS — M25671 Stiffness of right ankle, not elsewhere classified: Secondary | ICD-10-CM

## 2020-10-21 DIAGNOSIS — M25571 Pain in right ankle and joints of right foot: Secondary | ICD-10-CM
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
## 2020-11-03 ENCOUNTER — Ambulatory Visit: Payer: 59 | Attending: Medical

## 2020-11-03 ENCOUNTER — Other Ambulatory Visit: Payer: Self-pay

## 2020-11-03 DIAGNOSIS — R262 Difficulty in walking, not elsewhere classified: Secondary | ICD-10-CM | POA: Insufficient documentation

## 2020-11-03 DIAGNOSIS — M25571 Pain in right ankle and joints of right foot: Secondary | ICD-10-CM | POA: Diagnosis not present

## 2020-11-03 DIAGNOSIS — M25671 Stiffness of right ankle, not elsewhere classified: Secondary | ICD-10-CM | POA: Diagnosis not present
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
## 2020-11-06 ENCOUNTER — Other Ambulatory Visit: Payer: Self-pay

## 2020-11-06 ENCOUNTER — Ambulatory Visit: Payer: 59

## 2020-11-06 DIAGNOSIS — M25671 Stiffness of right ankle, not elsewhere classified: Secondary | ICD-10-CM | POA: Diagnosis not present

## 2020-11-06 DIAGNOSIS — M25571 Pain in right ankle and joints of right foot: Secondary | ICD-10-CM | POA: Diagnosis not present

## 2020-11-06 DIAGNOSIS — R262 Difficulty in walking, not elsewhere classified: Secondary | ICD-10-CM | POA: Diagnosis not present
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
## 2020-11-10 ENCOUNTER — Ambulatory Visit: Payer: 59

## 2020-11-10 ENCOUNTER — Other Ambulatory Visit: Payer: Self-pay

## 2020-11-10 DIAGNOSIS — M25671 Stiffness of right ankle, not elsewhere classified: Secondary | ICD-10-CM

## 2020-11-10 DIAGNOSIS — M25571 Pain in right ankle and joints of right foot: Secondary | ICD-10-CM | POA: Diagnosis not present

## 2020-11-10 DIAGNOSIS — R262 Difficulty in walking, not elsewhere classified: Secondary | ICD-10-CM | POA: Diagnosis not present
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
## 2020-11-12 ENCOUNTER — Ambulatory Visit: Payer: 59

## 2020-11-12 ENCOUNTER — Other Ambulatory Visit: Payer: Self-pay

## 2020-11-12 DIAGNOSIS — M25671 Stiffness of right ankle, not elsewhere classified: Secondary | ICD-10-CM | POA: Diagnosis not present

## 2020-11-12 DIAGNOSIS — M25571 Pain in right ankle and joints of right foot: Secondary | ICD-10-CM | POA: Diagnosis not present

## 2020-11-12 DIAGNOSIS — R262 Difficulty in walking, not elsewhere classified: Secondary | ICD-10-CM | POA: Diagnosis not present
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
## 2020-11-17 ENCOUNTER — Ambulatory Visit: Payer: 59

## 2020-11-19 ENCOUNTER — Other Ambulatory Visit: Payer: Self-pay

## 2020-11-19 ENCOUNTER — Encounter: Payer: Self-pay | Admitting: Physician Assistant

## 2020-11-19 ENCOUNTER — Ambulatory Visit: Payer: Self-pay | Admitting: Physician Assistant

## 2020-11-19 VITALS — BP 128/96 | HR 97 | Temp 98.6°F | Resp 14 | Ht 73.0 in | Wt 280.0 lb

## 2020-11-19 DIAGNOSIS — R21 Rash and other nonspecific skin eruption: Secondary | ICD-10-CM

## 2020-11-19 MED ORDER — HYDROXYZINE HCL 50 MG PO TABS: 50.0000 mg | ORAL_TABLET | Freq: Three times a day (TID) | ORAL | 0 refills | Status: DC | PRN

## 2020-11-19 MED ORDER — TRIAMCINOLONE ACETONIDE 40 MG/ML IJ SUSP
40.0000 mg | Freq: Once | INTRAMUSCULAR | Status: AC
  Administered 2020-11-19: 40 mg via INTRAMUSCULAR

## 2020-11-19 MED ORDER — METHYLPREDNISOLONE 4 MG PO TBPK: ORAL_TABLET | ORAL | 0 refills | Status: DC

## 2020-11-19 NOTE — Progress Notes (Signed)
   Subjective: Contact dermatitis    Patient ID: William Simpson, male    DOB: 1986/02/20, 34 y.o.   MRN: [id number]  HPI Patient complains of a rash which started yesterday when he was [place] the [place].  Patient state the rash caused hives and lip swelling.  Patient is able to urgent care clinic he was given IV Benadryl and fluids.  Patient state rest start clearing but returned again this morning.  Patient was not prescribed any form of medications.  Denies any anaphylactic signs or symptoms today.  Complains of just hives and itching.  Patient stated he did not know what he came in contact with.  No prior history of contact dermatitis planes.  Review of Systems    Negative septal complaint. Objective:   Physical Exam No acute distress.  Diffuse hives radiating from the feet to the torso.  No signs of secondary infection.       Assessment & Plan: Contact dermatitis.  Discussed with patient rationale for starting steroids consisting of 40 mg of Kenalog IM.  Patient given a prescription for Atarax and Medrol Dosepak.  Patient advised to follow-up with the emergency room if no improvement or worsening of complaint in the next 48 hours.  Patient advised and addressed effects of the antihistamine.  Patient given a work note and will return back 11/24/2020 before starting work

## 2020-11-19 NOTE — Progress Notes (Signed)
Called requesting appt to see provider in clinic.  States yesterday he had an allergic reaction to "who knows what" & had to go to a [employer] near [place] & received IV Benadryl drip by EMS. No ED visit. Was advised to follow-up with PCP.  AMD

## 2020-11-20 ENCOUNTER — Ambulatory Visit: Payer: 59

## 2020-11-20 NOTE — Therapy (Signed)
[place] [facility] PHYS[facility]ORTS MEDICINE [address], [name], [address] Phone: [phone]   Fax:  [phone]  Patient Details  Name: William Simpson MRN: [id number] Date of Birth: Nov 19, 1986 Referring Provider:  Cora Collum, *  Encounter Date: 11/20/2020    Patient arrived, did not see patient secondary to recent ER visits.   Myrene Galas, PT DPT 11/20/2020, 1:20 PM  [place] [facility] PHYSICAL AND SPORTS MEDICINE [address] [name], [address] Phone: [phone]   Fax:  [phone]

## 2020-11-25 ENCOUNTER — Ambulatory Visit: Payer: 59

## 2020-11-26 ENCOUNTER — Ambulatory Visit: Payer: 59 | Attending: Medical

## 2020-11-26 ENCOUNTER — Other Ambulatory Visit: Payer: Self-pay

## 2020-11-26 DIAGNOSIS — M25571 Pain in right ankle and joints of right foot: Secondary | ICD-10-CM | POA: Insufficient documentation

## 2020-11-26 DIAGNOSIS — M25671 Stiffness of right ankle, not elsewhere classified: Secondary | ICD-10-CM | POA: Insufficient documentation

## 2020-11-26 DIAGNOSIS — R262 Difficulty in walking, not elsewhere classified: Secondary | ICD-10-CM | POA: Insufficient documentation
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
## 2020-12-04 ENCOUNTER — Ambulatory Visit: Payer: 59

## 2020-12-04 ENCOUNTER — Other Ambulatory Visit: Payer: Self-pay

## 2020-12-04 DIAGNOSIS — M25571 Pain in right ankle and joints of right foot: Secondary | ICD-10-CM | POA: Diagnosis not present

## 2020-12-04 DIAGNOSIS — R262 Difficulty in walking, not elsewhere classified: Secondary | ICD-10-CM | POA: Diagnosis not present

## 2020-12-04 DIAGNOSIS — M25671 Stiffness of right ankle, not elsewhere classified: Secondary | ICD-10-CM | POA: Diagnosis not present
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
## 2020-12-09 ENCOUNTER — Ambulatory Visit: Payer: 59

## 2020-12-11 ENCOUNTER — Ambulatory Visit: Payer: 59

## 2020-12-15 ENCOUNTER — Ambulatory Visit: Payer: 59

## 2020-12-16 ENCOUNTER — Ambulatory Visit: Payer: 59

## 2020-12-16 ENCOUNTER — Other Ambulatory Visit: Payer: Self-pay

## 2020-12-16 DIAGNOSIS — M25671 Stiffness of right ankle, not elsewhere classified: Secondary | ICD-10-CM

## 2020-12-16 DIAGNOSIS — R262 Difficulty in walking, not elsewhere classified: Secondary | ICD-10-CM | POA: Diagnosis not present

## 2020-12-16 DIAGNOSIS — M25571 Pain in right ankle and joints of right foot: Secondary | ICD-10-CM | POA: Diagnosis not present
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
## 2020-12-18 ENCOUNTER — Other Ambulatory Visit: Payer: Self-pay

## 2020-12-18 ENCOUNTER — Ambulatory Visit: Payer: 59

## 2020-12-18 DIAGNOSIS — M25571 Pain in right ankle and joints of right foot: Secondary | ICD-10-CM | POA: Diagnosis not present

## 2020-12-18 DIAGNOSIS — R262 Difficulty in walking, not elsewhere classified: Secondary | ICD-10-CM

## 2020-12-18 DIAGNOSIS — M25671 Stiffness of right ankle, not elsewhere classified: Secondary | ICD-10-CM | POA: Diagnosis not present
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
## 2020-12-22 ENCOUNTER — Other Ambulatory Visit: Payer: Self-pay

## 2020-12-22 ENCOUNTER — Ambulatory Visit: Payer: 59

## 2020-12-22 DIAGNOSIS — M25571 Pain in right ankle and joints of right foot: Secondary | ICD-10-CM | POA: Diagnosis not present

## 2020-12-22 DIAGNOSIS — R262 Difficulty in walking, not elsewhere classified: Secondary | ICD-10-CM | POA: Diagnosis not present

## 2020-12-22 DIAGNOSIS — M25671 Stiffness of right ankle, not elsewhere classified: Secondary | ICD-10-CM | POA: Diagnosis not present
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
## 2020-12-24 ENCOUNTER — Ambulatory Visit: Payer: 59 | Attending: Medical

## 2020-12-24 ENCOUNTER — Other Ambulatory Visit: Payer: Self-pay

## 2020-12-24 DIAGNOSIS — M25571 Pain in right ankle and joints of right foot: Secondary | ICD-10-CM | POA: Diagnosis not present

## 2020-12-24 DIAGNOSIS — M25671 Stiffness of right ankle, not elsewhere classified: Secondary | ICD-10-CM | POA: Diagnosis not present

## 2020-12-24 DIAGNOSIS — R262 Difficulty in walking, not elsewhere classified: Secondary | ICD-10-CM | POA: Diagnosis not present
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
## 2020-12-30 ENCOUNTER — Ambulatory Visit: Payer: 59

## 2020-12-30 ENCOUNTER — Other Ambulatory Visit: Payer: Self-pay

## 2020-12-30 DIAGNOSIS — R262 Difficulty in walking, not elsewhere classified: Secondary | ICD-10-CM | POA: Diagnosis not present

## 2020-12-30 DIAGNOSIS — M25571 Pain in right ankle and joints of right foot: Secondary | ICD-10-CM

## 2020-12-30 DIAGNOSIS — M25671 Stiffness of right ankle, not elsewhere classified: Secondary | ICD-10-CM
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
## 2021-01-01 ENCOUNTER — Other Ambulatory Visit: Payer: Self-pay

## 2021-01-01 ENCOUNTER — Ambulatory Visit: Payer: 59

## 2021-01-01 DIAGNOSIS — M25571 Pain in right ankle and joints of right foot: Secondary | ICD-10-CM

## 2021-01-01 DIAGNOSIS — M25671 Stiffness of right ankle, not elsewhere classified: Secondary | ICD-10-CM

## 2021-01-01 DIAGNOSIS — R262 Difficulty in walking, not elsewhere classified: Secondary | ICD-10-CM
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
## 2021-01-05 ENCOUNTER — Ambulatory Visit: Payer: 59

## 2021-01-07 ENCOUNTER — Ambulatory Visit: Payer: 59

## 2021-01-07 ENCOUNTER — Other Ambulatory Visit: Payer: Self-pay

## 2021-01-07 DIAGNOSIS — M25671 Stiffness of right ankle, not elsewhere classified: Secondary | ICD-10-CM

## 2021-01-07 DIAGNOSIS — R262 Difficulty in walking, not elsewhere classified: Secondary | ICD-10-CM

## 2021-01-07 DIAGNOSIS — M25571 Pain in right ankle and joints of right foot: Secondary | ICD-10-CM
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
## 2021-01-13 ENCOUNTER — Ambulatory Visit: Payer: 59

## 2021-01-13 ENCOUNTER — Other Ambulatory Visit: Payer: Self-pay

## 2021-01-13 DIAGNOSIS — R262 Difficulty in walking, not elsewhere classified: Secondary | ICD-10-CM | POA: Diagnosis not present

## 2021-01-13 DIAGNOSIS — M25571 Pain in right ankle and joints of right foot: Secondary | ICD-10-CM

## 2021-01-13 DIAGNOSIS — M25671 Stiffness of right ankle, not elsewhere classified: Secondary | ICD-10-CM
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
## 2021-01-15 ENCOUNTER — Other Ambulatory Visit: Payer: Self-pay

## 2021-01-15 ENCOUNTER — Ambulatory Visit: Payer: 59

## 2021-01-15 DIAGNOSIS — M25571 Pain in right ankle and joints of right foot: Secondary | ICD-10-CM

## 2021-01-15 DIAGNOSIS — R262 Difficulty in walking, not elsewhere classified: Secondary | ICD-10-CM | POA: Diagnosis not present

## 2021-01-15 DIAGNOSIS — M25671 Stiffness of right ankle, not elsewhere classified: Secondary | ICD-10-CM | POA: Diagnosis not present
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
## 2021-01-19 ENCOUNTER — Ambulatory Visit: Payer: 59

## 2021-01-21 ENCOUNTER — Other Ambulatory Visit: Payer: Self-pay

## 2021-01-21 ENCOUNTER — Ambulatory Visit: Payer: 59 | Attending: Medical

## 2021-01-21 DIAGNOSIS — M25571 Pain in right ankle and joints of right foot: Secondary | ICD-10-CM | POA: Diagnosis not present

## 2021-01-21 DIAGNOSIS — M25671 Stiffness of right ankle, not elsewhere classified: Secondary | ICD-10-CM

## 2021-01-21 DIAGNOSIS — R262 Difficulty in walking, not elsewhere classified: Secondary | ICD-10-CM
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
## 2021-01-26 ENCOUNTER — Ambulatory Visit: Payer: 59

## 2021-01-26 ENCOUNTER — Other Ambulatory Visit: Payer: Self-pay

## 2021-01-26 DIAGNOSIS — R262 Difficulty in walking, not elsewhere classified: Secondary | ICD-10-CM | POA: Diagnosis not present

## 2021-01-26 DIAGNOSIS — M25671 Stiffness of right ankle, not elsewhere classified: Secondary | ICD-10-CM | POA: Diagnosis not present

## 2021-01-26 DIAGNOSIS — M25571 Pain in right ankle and joints of right foot: Secondary | ICD-10-CM
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
## 2021-01-28 ENCOUNTER — Other Ambulatory Visit: Payer: Self-pay

## 2021-01-28 ENCOUNTER — Ambulatory Visit: Payer: 59

## 2021-01-28 DIAGNOSIS — R262 Difficulty in walking, not elsewhere classified: Secondary | ICD-10-CM

## 2021-01-28 DIAGNOSIS — M25671 Stiffness of right ankle, not elsewhere classified: Secondary | ICD-10-CM

## 2021-01-28 DIAGNOSIS — M25571 Pain in right ankle and joints of right foot: Secondary | ICD-10-CM
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
## 2021-02-03 ENCOUNTER — Ambulatory Visit: Payer: 59

## 2021-02-05 ENCOUNTER — Ambulatory Visit: Payer: 59

## 2021-02-09 ENCOUNTER — Ambulatory Visit: Payer: 59

## 2021-02-12 ENCOUNTER — Other Ambulatory Visit: Payer: Self-pay

## 2021-02-12 ENCOUNTER — Ambulatory Visit: Payer: 59

## 2021-02-12 DIAGNOSIS — M25571 Pain in right ankle and joints of right foot: Secondary | ICD-10-CM | POA: Diagnosis not present

## 2021-02-12 DIAGNOSIS — R262 Difficulty in walking, not elsewhere classified: Secondary | ICD-10-CM | POA: Diagnosis not present

## 2021-02-12 DIAGNOSIS — M25671 Stiffness of right ankle, not elsewhere classified: Secondary | ICD-10-CM
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
## 2021-02-16 ENCOUNTER — Other Ambulatory Visit: Payer: Self-pay

## 2021-02-16 ENCOUNTER — Ambulatory Visit: Payer: 59

## 2021-02-16 DIAGNOSIS — R262 Difficulty in walking, not elsewhere classified: Secondary | ICD-10-CM

## 2021-02-16 DIAGNOSIS — M25571 Pain in right ankle and joints of right foot: Secondary | ICD-10-CM | POA: Diagnosis not present

## 2021-02-16 DIAGNOSIS — M25671 Stiffness of right ankle, not elsewhere classified: Secondary | ICD-10-CM
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
## 2021-02-18 ENCOUNTER — Ambulatory Visit: Payer: 59

## 2021-02-23 ENCOUNTER — Ambulatory Visit: Payer: 59 | Attending: Medical

## 2021-02-23 DIAGNOSIS — M25571 Pain in right ankle and joints of right foot: Secondary | ICD-10-CM | POA: Insufficient documentation

## 2021-02-23 DIAGNOSIS — R262 Difficulty in walking, not elsewhere classified: Secondary | ICD-10-CM | POA: Insufficient documentation

## 2021-02-23 DIAGNOSIS — M25671 Stiffness of right ankle, not elsewhere classified: Secondary | ICD-10-CM | POA: Insufficient documentation

## 2021-02-25 ENCOUNTER — Other Ambulatory Visit: Payer: Self-pay

## 2021-02-25 ENCOUNTER — Ambulatory Visit: Payer: 59

## 2021-02-25 DIAGNOSIS — M25671 Stiffness of right ankle, not elsewhere classified: Secondary | ICD-10-CM | POA: Diagnosis not present

## 2021-02-25 DIAGNOSIS — M25571 Pain in right ankle and joints of right foot: Secondary | ICD-10-CM | POA: Diagnosis not present

## 2021-02-25 DIAGNOSIS — R262 Difficulty in walking, not elsewhere classified: Secondary | ICD-10-CM

## 2021-02-25 NOTE — Addendum Note (Signed)
Addended by: Bethanie Dicker on: 02/25/2021 10:47 AM   Modules accepted: Orders
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
## 2021-03-04 ENCOUNTER — Ambulatory Visit: Payer: 59

## 2021-03-11 ENCOUNTER — Ambulatory Visit: Payer: 59

## 2021-03-18 ENCOUNTER — Ambulatory Visit: Payer: 59

## 2021-03-25 ENCOUNTER — Ambulatory Visit: Payer: 59

## 2021-04-09 ENCOUNTER — Encounter: Payer: Self-pay | Admitting: Physician Assistant

## 2021-04-09 ENCOUNTER — Other Ambulatory Visit: Payer: Self-pay

## 2021-04-09 ENCOUNTER — Ambulatory Visit: Payer: Self-pay | Admitting: Physician Assistant

## 2021-04-09 VITALS — Temp 97.6°F | Resp 16 | Ht 73.0 in | Wt 275.0 lb

## 2021-04-09 DIAGNOSIS — S90852A Superficial foreign body, left foot, initial encounter: Secondary | ICD-10-CM

## 2021-04-09 MED ORDER — OXYCODONE-ACETAMINOPHEN 7.5-325 MG PO TABS: 1.0000 | ORAL_TABLET | Freq: Four times a day (QID) | ORAL | 0 refills | Status: DC | PRN

## 2021-04-09 MED ORDER — DOXYCYCLINE MONOHYDRATE 100 MG PO CAPS: 100.0000 mg | ORAL_CAPSULE | Freq: Two times a day (BID) | ORAL | 0 refills | Status: DC

## 2021-04-09 NOTE — Progress Notes (Signed)
   Subjective: Foreign body left foot    Patient ID: William Simpson, male    DOB: [birth year], 35 y.o.   MRN: [id number]  HPI Patient presents with foreign body plantar aspect of left calcaneus for approximately 10 days.  Patient state he was walking on a wooden deck when he got a splinter into his foot.  Patient state he was able to remove most of the splinter but feels that there is residual material.  Increased pain with weightbearing and ambulation.  Negative except for complaint   Review of Systems     Objective:   Physical Exam Patient ambulates with atypical gait.  Edema and erythema plantar aspect of left heel.       Assessment & Plan: Foreign body right heel  Have a surgical clean and a local block using 1% lidocaine with 5 epinephrine was instilled.  2 mL plantar aspect of the left heel.  Using a #11 blade small incision was made and foreign body was removed with forceps.  Area was cleaned and bandaged.  Patient given discharge care instruction.  Patient given a prescription for doxycycline and 3-day supply of Percocet as needed for pain.  Advised Epson salt soaks 5 to 10 minutes once a day.  Follow-up in 4 days if no improvement or worsening complaint.

## 2021-04-25 ENCOUNTER — Other Ambulatory Visit: Payer: Self-pay

## 2021-04-25 ENCOUNTER — Emergency Department (HOSPITAL_BASED_OUTPATIENT_CLINIC_OR_DEPARTMENT_OTHER)
Admission: EM | Admit: 2021-04-25 | Discharge: 2021-04-25 | Disposition: A | Discharge status: Home / Self Care | Payer: 59 | Attending: Emergency Medicine | Admitting: Emergency Medicine

## 2021-04-25 DIAGNOSIS — S61214A Laceration without foreign body of right ring finger without damage to nail, initial encounter: Secondary | ICD-10-CM | POA: Insufficient documentation

## 2021-04-25 DIAGNOSIS — F17200 Nicotine dependence, unspecified, uncomplicated: Secondary | ICD-10-CM | POA: Insufficient documentation

## 2021-04-25 DIAGNOSIS — R69 Illness, unspecified: Secondary | ICD-10-CM | POA: Diagnosis not present

## 2021-04-25 DIAGNOSIS — W268XXA Contact with other sharp object(s), not elsewhere classified, initial encounter: Secondary | ICD-10-CM | POA: Diagnosis not present

## 2021-04-25 DIAGNOSIS — Z7982 Long term (current) use of aspirin: Secondary | ICD-10-CM | POA: Diagnosis not present

## 2021-04-25 DIAGNOSIS — I10 Essential (primary) hypertension: Secondary | ICD-10-CM | POA: Diagnosis not present

## 2021-04-25 DIAGNOSIS — S60944A Unspecified superficial injury of right ring finger, initial encounter: Secondary | ICD-10-CM | POA: Diagnosis present

## 2021-04-25 MED ORDER — CEPHALEXIN 250 MG PO CAPS
500.0000 mg | ORAL_CAPSULE | Freq: Once | ORAL | Status: AC
  Administered 2021-04-25: 500 mg via ORAL
  Filled 2021-04-25: qty 2

## 2021-04-25 MED ORDER — CEPHALEXIN 500 MG PO CAPS
500.0000 mg | ORAL_CAPSULE | Freq: Two times a day (BID) | ORAL | 0 refills | Status: AC
Start: 2021-04-25 — End: 2021-05-02
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
## 2021-04-25 NOTE — Discharge Instructions (Signed)
Keep the wound clean and dry for the next 48 hours and then you can replace dressing as discussed.

## 2021-04-25 NOTE — ED Triage Notes (Signed)
Pt to ED from camp with c/o right finger laceration that pt received from metal clip on ski rope.

## 2021-04-27 NOTE — Progress Notes (Deleted)
Pt scheduled to complete annual physical with Mila Merry 05/01/21.  CL,RMA

## 2021-04-28 DIAGNOSIS — Z0000 Encounter for general adult medical examination without abnormal findings: Secondary | ICD-10-CM

## 2021-04-30 NOTE — Progress Notes (Deleted)
Needs to scheduled provider appt to complete physical.  AMD

## 2021-05-01 ENCOUNTER — Encounter: Payer: 59 | Admitting: Adult Health

## 2021-11-05 ENCOUNTER — Other Ambulatory Visit: Payer: Self-pay
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
## 2021-11-09 ENCOUNTER — Other Ambulatory Visit: Payer: Self-pay

## 2021-11-09 DIAGNOSIS — Z1152 Encounter for screening for COVID-19: Secondary | ICD-10-CM

## 2021-11-09 LAB — POC COVID19 BINAXNOW: SARS Coronavirus 2 Ag: NEGATIVE

## 2021-11-09 LAB — POCT INFLUENZA A/B
Influenza A, POC: NEGATIVE
Influenza B, POC: NEGATIVE

## 2021-11-09 NOTE — Progress Notes (Signed)
Pt presents today with symptoms cough, congestion, body aches with no fever that started this morning./CL,RMA

## 2022-04-23 ENCOUNTER — Ambulatory Visit: Payer: Self-pay

## 2022-04-23 DIAGNOSIS — Z0000 Encounter for general adult medical examination without abnormal findings: Secondary | ICD-10-CM

## 2022-04-24 LAB — CMP12+LP+TP+TSH+6AC+CBC/D/PLT
ALT: 102 IU/L — ABNORMAL HIGH (ref 0–44)
AST: 43 IU/L — ABNORMAL HIGH (ref 0–40)
Albumin/Globulin Ratio: 1.5 (ref 1.2–2.2)
Albumin: 4.4 g/dL (ref 4.0–5.0)
Alkaline Phosphatase: 49 IU/L (ref 44–121)
BUN/Creatinine Ratio: 13 (ref 9–20)
BUN: 12 mg/dL (ref 6–20)
Basophils Absolute: 0.1 10*3/uL (ref 0.0–0.2)
Basos: 1 %
Bilirubin Total: 0.7 mg/dL (ref 0.0–1.2)
Calcium: 9.7 mg/dL (ref 8.7–10.2)
Chloride: 99 mmol/L (ref 96–106)
Chol/HDL Ratio: 4 ratio (ref 0.0–5.0)
Cholesterol, Total: 235 mg/dL — ABNORMAL HIGH (ref 100–199)
Creatinine, Ser: 0.94 mg/dL (ref 0.76–1.27)
EOS (ABSOLUTE): 0.1 10*3/uL (ref 0.0–0.4)
Eos: 2 %
Estimated CHD Risk: 0.7 times avg. (ref 0.0–1.0)
Free Thyroxine Index: 2.3 (ref 1.2–4.9)
GGT: 77 IU/L — ABNORMAL HIGH (ref 0–65)
Globulin, Total: 2.9 g/dL (ref 1.5–4.5)
Glucose: 102 mg/dL — ABNORMAL HIGH (ref 70–99)
HDL: 59 mg/dL (ref >39)
Hematocrit: 49 % (ref 37.5–51.0)
Hemoglobin: 17.1 g/dL (ref 13.0–17.7)
Immature Grans (Abs): 0 10*3/uL (ref 0.0–0.1)
Immature Granulocytes: 1 %
Iron: 188 ug/dL — ABNORMAL HIGH (ref 38–169)
LDH: 229 IU/L — ABNORMAL HIGH (ref 121–224)
LDL Chol Calc (NIH): 160 mg/dL — ABNORMAL HIGH (ref 0–99)
Lymphocytes Absolute: 2 10*3/uL (ref 0.7–3.1)
Lymphs: 35 %
MCH: 33.5 pg — ABNORMAL HIGH (ref 26.6–33.0)
MCHC: 34.9 g/dL (ref 31.5–35.7)
MCV: 96 fL (ref 79–97)
Monocytes Absolute: 0.5 10*3/uL (ref 0.1–0.9)
Monocytes: 9 %
Neutrophils Absolute: 3.2 10*3/uL (ref 1.4–7.0)
Neutrophils: 52 %
Phosphorus: 4.1 mg/dL (ref 2.8–4.1)
Platelets: 207 10*3/uL (ref 150–450)
Potassium: 4.3 mmol/L (ref 3.5–5.2)
RBC: 5.11 x10E6/uL (ref 4.14–5.80)
RDW: 12.6 % (ref 11.6–15.4)
Sodium: 139 mmol/L (ref 134–144)
T3 Uptake Ratio: 29 % (ref 24–39)
T4, Total: 8 ug/dL (ref 4.5–12.0)
TSH: 4.72 u[IU]/mL — ABNORMAL HIGH (ref 0.450–4.500)
Total Protein: 7.3 g/dL (ref 6.0–8.5)
Triglycerides: 94 mg/dL (ref 0–149)
Uric Acid: 6.5 mg/dL (ref 3.8–8.4)
VLDL Cholesterol Cal: 16 mg/dL (ref 5–40)
WBC: 5.9 10*3/uL (ref 3.4–10.8)
eGFR: 107 mL/min/{1.73_m2} (ref >59)

## 2022-04-26 ENCOUNTER — Encounter: Payer: 59 | Admitting: Physician Assistant

## 2022-04-27 ENCOUNTER — Encounter: Payer: 59 | Admitting: Physician Assistant

## 2022-05-10 ENCOUNTER — Ambulatory Visit: Payer: Self-pay | Admitting: Physician Assistant

## 2022-05-10 ENCOUNTER — Encounter: Payer: Self-pay | Admitting: Physician Assistant

## 2022-05-10 VITALS — BP 140/88 | Temp 98.4°F | Resp 14 | Ht 73.0 in | Wt 302.0 lb

## 2022-05-10 DIAGNOSIS — Z0000 Encounter for general adult medical examination without abnormal findings: Secondary | ICD-10-CM

## 2022-05-10 NOTE — Progress Notes (Unsigned)
Pt presents today to complete physical, Pt denies any issues or concerns at this time/CL,RMA 
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
## 2022-05-11 LAB — POCT URINALYSIS DIPSTICK
Bilirubin, UA: NEGATIVE
Blood, UA: NEGATIVE
Glucose, UA: NEGATIVE
Ketones, UA: NEGATIVE
Leukocytes, UA: NEGATIVE
Nitrite, UA: NEGATIVE
Protein, UA: NEGATIVE
Spec Grav, UA: 1.02 (ref 1.010–1.025)
Urobilinogen, UA: 0.2 E.U./dL
pH, UA: 6 (ref 5.0–8.0)

## 2022-05-24 ENCOUNTER — Encounter: Payer: Self-pay | Admitting: Physician Assistant

## 2022-05-24 ENCOUNTER — Ambulatory Visit: Payer: 59 | Admitting: Physician Assistant

## 2022-05-24 DIAGNOSIS — R197 Diarrhea, unspecified: Secondary | ICD-10-CM
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
## 2022-05-24 NOTE — Progress Notes (Signed)
Pt has had a stomach bug for a week with vomiting and diarrhea. As of today only diarrhea and has had at least 3 loose bowel movements today.

## 2022-10-04 ENCOUNTER — Other Ambulatory Visit: Payer: Self-pay | Admitting: Physician Assistant

## 2022-10-04 MED ORDER — CLARITHROMYCIN 500 MG PO TABS: 500.0000 mg | ORAL_TABLET | Freq: Two times a day (BID) | ORAL | 0 refills | Status: DC

## 2022-10-04 MED ORDER — PSEUDOEPH-BROMPHEN-DM 30-2-10 MG/5ML PO SYRP: 5.0000 mL | ORAL_SOLUTION | Freq: Four times a day (QID) | ORAL | 0 refills | Status: DC | PRN

## 2022-10-04 MED ORDER — LIDOCAINE VISCOUS HCL 2 % MT SOLN: 5.0000 mL | Freq: Four times a day (QID) | OROMUCOSAL | 0 refills | Status: DC | PRN

## 2022-10-26 DIAGNOSIS — J069 Acute upper respiratory infection, unspecified: Secondary | ICD-10-CM | POA: Diagnosis not present

## 2022-10-31 DIAGNOSIS — J0140 Acute pansinusitis, unspecified: Secondary | ICD-10-CM | POA: Diagnosis not present

## 2022-10-31 DIAGNOSIS — J209 Acute bronchitis, unspecified: Secondary | ICD-10-CM | POA: Diagnosis not present

## 2022-11-03 ENCOUNTER — Ambulatory Visit: Payer: Self-pay | Admitting: Physician Assistant

## 2022-11-03 ENCOUNTER — Encounter: Payer: Self-pay | Admitting: Physician Assistant

## 2022-11-03 VITALS — BP 142/92 | HR 82 | Temp 97.5°F | Resp 16

## 2022-11-03 DIAGNOSIS — R051 Acute cough: Secondary | ICD-10-CM

## 2022-11-03 LAB — POCT INFLUENZA A/B
Influenza A, POC: NEGATIVE
Influenza B, POC: NEGATIVE

## 2022-11-03 LAB — POC COVID19 BINAXNOW: SARS Coronavirus 2 Ag: NEGATIVE

## 2022-11-03 MED ORDER — METHYLPREDNISOLONE 4 MG PO TBPK: ORAL_TABLET | ORAL | 0 refills | Status: DC

## 2022-11-03 MED ORDER — PROMETHAZINE-DM 6.25-15 MG/5ML PO SYRP: 5.0000 mL | ORAL_SOLUTION | Freq: Four times a day (QID) | ORAL | 0 refills | Status: DC | PRN

## 2022-11-03 NOTE — Progress Notes (Signed)
   Subjective: Cough and congestion    Patient ID: Emiel Kielty, male    DOB: 05/22/1986, 36 y.o.   MRN: [id number]  HPI Patient complains of 9 days of cough and congestion.  Patient was seen by telehealth doctor on 10/31/2022 and prescribed amoxicillin.  Patient also given prescription for [facility].  Patient states cough is not improved with the Tessalon Perles.  Denies fever chills associated complaint.  Patient tested negative for influenza   Review of Systems Hypertension and obstructive sleep apnea.    Objective:   Physical Exam  BP 142/92, pulse 82, respirations 16, temperature 97.5, patient 97% O2 sat on room air. HEENT is unremarkable. Neck is supple without lymphadenopathy or bruits. Lungs clear to auscultation.  Increased cough with deep inspirations. Heart is regular rate and rhythm.      Assessment & Plan: Upper respiratory infection   Patient given prescription for Phenergan DM and Medrol Dosepak.  Advised to follow-up if no improvement in 3 to 5 days.

## 2022-11-03 NOTE — Progress Notes (Signed)
S/Sx started on 10/25/2022: Cough - productive at times (green phlegm) Fever of 101 (last fever was 2 days ago) Body aches Stuffy nose Pain around right eye Denies ear or teeth discomfort Diarrhea (last time was yesterday) N/V (last time was yesterday)  No flu vaccine this year  Taking OTC Teledoc visit over the weeked & Tx'd with ABX & cough suppressant Tessalon pearls aren't helping Had Rx cough med from last month & he's run out.  AMD

## 2023-01-26 ENCOUNTER — Encounter: Payer: Self-pay | Admitting: Physician Assistant

## 2023-01-26 ENCOUNTER — Ambulatory Visit: Payer: Self-pay | Admitting: Physician Assistant

## 2023-01-26 VITALS — BP 139/89 | HR 100 | Temp 96.9°F | Resp 14 | Wt 280.0 lb

## 2023-01-26 DIAGNOSIS — M7022 Olecranon bursitis, left elbow: Secondary | ICD-10-CM

## 2023-01-26 DIAGNOSIS — S86912A Strain of unspecified muscle(s) and tendon(s) at lower leg level, left leg, initial encounter: Secondary | ICD-10-CM

## 2023-01-26 MED ORDER — NAPROXEN 500 MG PO TABS: 500.0000 mg | ORAL_TABLET | Freq: Two times a day (BID) | ORAL | Status: DC

## 2023-01-26 MED ORDER — OXYCODONE-ACETAMINOPHEN 7.5-325 MG PO TABS: 1.0000 | ORAL_TABLET | Freq: Four times a day (QID) | ORAL | 0 refills | Status: DC | PRN

## 2023-01-26 NOTE — Progress Notes (Signed)
   Subjective: Left posterior elbow pain and edema.  Left bilateral knee pain    Patient ID: William Simpson, male    DOB: June 22, 1986, 37 y.o.   MRN: [id number]  HPI Patient was performed PT test at the [employer].  Patient tried to jump over a fence on his left foot caught on the fence and he fell landing on his left elbow.  Patient denies loss sensation or loss of function.  Patient stated is painful weightbearing.  Patient has noticed increased swelling to the posterior aspect of his elbow.   Review of Systems Negative except for chief complaint    Objective:   Physical Exam BP is 139/89, pulse 100, respiration 14, temperature 96.9 and patient is 95% O2 sat on room air.  Patient weighs 280 pounds and BMI is 36.94. No obvious deformity to the left elbow.  Full and equal range of motion.  Neurovascular intact.  Edema consistent with traumatic bursitis.  Examination left knee shows no obvious deformity.  Full and equal range of motion in the sitting position..  Patient has moderate guarding with palpation of the LCL.  Moderate crepitus with palpation of the anterior patella.  No laxity with stress testing.       Assessment & Plan:  Advised patient to monitor swelling of the bursa on the left elbow.  If condition worsens return to this clinic or the emergency room.  Patient given elastic knee support advised to wear only at work.  Patient given a prescription for naproxen Percocets.  Patient advised to just effects of the Percocet and advised not to take it at work or driving.  Patient return back in 5 days for reevaluation this condition worsens.

## 2023-01-26 NOTE — Progress Notes (Signed)
Workers comp injury today while participating in [employer] fitness test.  Stated left foot was caught on fence while going over it and landed on his feet, but stated pain began left knee after scaling fence.  Skin intact and pain level at this time level 4 at rest and goes to 8-9 with certain movements.  Denies injury to left knee prior.  Stated left elbow starting to feel pain level 2-3 and observed appearance of fluid filled left elbow pain with palpation with intact skin.

## 2023-01-31 ENCOUNTER — Ambulatory Visit: Payer: Self-pay | Admitting: Physician Assistant

## 2023-01-31 ENCOUNTER — Ambulatory Visit
Admission: RE | Admit: 2023-01-31 | Discharge: 2023-01-31 | Disposition: A | Discharge status: Home / Self Care | Payer: No Typology Code available for payment source | Source: Ambulatory Visit | Attending: Physician Assistant | Admitting: Physician Assistant

## 2023-01-31 ENCOUNTER — Ambulatory Visit
Admission: RE | Admit: 2023-01-31 | Discharge: 2023-01-31 | Disposition: A | Discharge status: Home / Self Care | Payer: No Typology Code available for payment source | Attending: Physician Assistant | Admitting: Physician Assistant

## 2023-01-31 ENCOUNTER — Encounter: Payer: Self-pay | Admitting: Physician Assistant

## 2023-01-31 VITALS — BP 127/80 | HR 84 | Temp 97.5°F | Resp 14

## 2023-01-31 DIAGNOSIS — M25562 Pain in left knee: Secondary | ICD-10-CM | POA: Diagnosis not present

## 2023-01-31 DIAGNOSIS — M25522 Pain in left elbow: Secondary | ICD-10-CM

## 2023-01-31 NOTE — Progress Notes (Signed)
   Subjective:    Patient ID: William Simpson, male    DOB: Dec 07, 1985, 37 y.o.   MRN: [id number]  HPI Patient is following up for left elbow and left knee pain secondary to fall while doing physical training at the [employer].  Patient's sustain traumatic bursitis to the left posterior elbow.  Patient continue to have left knee pain with ambulation.  Patient is wearing elastic support that was given initial injury date.   Review of Systems Negative except for complaint    Objective:   Physical Exam BP is 127/84, pulse 84, respiration 14, temperature 97.5, and patient 95% O2 sat on room air.  Examination of the left elbow shows reduced edema at the olecranon process.  Patient continues to have decreased range of motion with extension.  Left knee was deferred.      Assessment & Plan: Left elbow and left knee pain   Patient sent for imaging and will follow-up in 2 days.  Continue sedentary duty until reevaluation on 02/02/2023.

## 2023-01-31 NOTE — Progress Notes (Signed)
Follow up [place] injury DOI 01/26/23 stated left knee improved with current dull ache level 3 and left elbow sharp shooting pain when contact with something level 7 at that contact point as when resting elbow down.  Noted left elbow bursa appears with less fluid fluid upon observation from last visit.

## 2023-01-31 NOTE — Addendum Note (Signed)
Addended by: Aliene Altes on: 01/31/2023 11:35 AM   Modules accepted: Orders

## 2023-01-31 NOTE — Addendum Note (Signed)
Addended by: Aliene Altes on: 01/31/2023 11:38 AM   Modules accepted: Orders

## 2023-02-03 ENCOUNTER — Ambulatory Visit: Payer: Self-pay | Admitting: Physician Assistant

## 2023-02-03 ENCOUNTER — Encounter: Payer: Self-pay | Admitting: Physician Assistant

## 2023-02-03 DIAGNOSIS — M25562 Pain in left knee: Secondary | ICD-10-CM

## 2023-02-03 DIAGNOSIS — M25422 Effusion, left elbow: Secondary | ICD-10-CM

## 2023-02-03 NOTE — Progress Notes (Signed)
Pt presents today for follow up workers comp, DOI 01/26/23 Lft kee. Pt states his knee is getting better. Burna Sis

## 2023-02-03 NOTE — Progress Notes (Signed)
   Subjective: Left elbow      Patient ID: Brandan Robicheaux, male    DOB: 14-Feb-1986, 37 y.o.   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  Patient was placed on a no work status given anti-inflammatory and pain medications.  Patient stated much improvement since their injury 01/25/2013.   Review of Systems Negative except for above complaint    Objective:   Physical Exam See nurses note for physical exam. No acute distress. Examination of the left elbow and left knee reveals no obvious deformity.  Patient has moderate guarding with palpation of the olecranon process.  Patient has full and equal l range of motion of the left elbow and left knee.  Strength against resistance is 4/5.  X-rays reveals no acute findings.       Assessment & Plan: Left elbow and knee pain   Patient advised to continue anti-inflammatory medication.  Patient moved from no work status to sedentary work.  Patient will follow-up in 4 days.  Anticipate return to full duties.

## 2023-02-07 ENCOUNTER — Ambulatory Visit: Payer: Self-pay | Admitting: Physician Assistant

## 2023-02-07 ENCOUNTER — Encounter: Payer: Self-pay | Admitting: Physician Assistant

## 2023-02-07 VITALS — BP 143/104 | HR 90 | Resp 14 | Ht 73.0 in | Wt 280.0 lb

## 2023-02-07 DIAGNOSIS — M25562 Pain in left knee: Secondary | ICD-10-CM

## 2023-02-07 NOTE — Progress Notes (Signed)
WC f/u DOI 01/26/23.  Left knee injury from [employer].
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
## 2023-03-16 DIAGNOSIS — S8392XA Sprain of unspecified site of left knee, initial encounter: Secondary | ICD-10-CM | POA: Insufficient documentation

## 2023-04-05 ENCOUNTER — Other Ambulatory Visit: Payer: Self-pay

## 2023-04-05 DIAGNOSIS — Z0283 Encounter for blood-alcohol and blood-drug test: Secondary | ICD-10-CM

## 2023-04-05 NOTE — Progress Notes (Signed)
Pt completed random UDS &ETOH. Pending results for UDS with [facility]. ETOH is cleared./CL,RMA  

## 2023-05-03 ENCOUNTER — Ambulatory Visit: Payer: Self-pay

## 2023-05-03 VITALS — BP 147/93 | HR 88 | Temp 97.7°F | Resp 14 | Ht 73.0 in | Wt 280.0 lb

## 2023-05-03 DIAGNOSIS — Z0000 Encounter for general adult medical examination without abnormal findings: Secondary | ICD-10-CM

## 2023-05-03 NOTE — Progress Notes (Signed)
Pt presents today to complete physical, Pt denies any issues or concerns at this time/CL,RMA 

## 2023-05-04 LAB — CMP12+LP+TP+TSH+6AC+CBC/D/PLT
ALT: 109 IU/L — ABNORMAL HIGH (ref 0–44)
AST: 66 IU/L — ABNORMAL HIGH (ref 0–40)
Albumin/Globulin Ratio: 1.5
Albumin: 4.4 g/dL (ref 4.1–5.1)
Alkaline Phosphatase: 45 IU/L (ref 44–121)
BUN/Creatinine Ratio: 8 — ABNORMAL LOW (ref 9–20)
BUN: 7 mg/dL (ref 6–20)
Basophils Absolute: 0 10*3/uL (ref 0.0–0.2)
Basos: 1 %
Bilirubin Total: 0.5 mg/dL (ref 0.0–1.2)
Calcium: 9.5 mg/dL (ref 8.7–10.2)
Chloride: 103 mmol/L (ref 96–106)
Chol/HDL Ratio: 3.7 ratio (ref 0.0–5.0)
Cholesterol, Total: 210 mg/dL — ABNORMAL HIGH (ref 100–199)
Creatinine, Ser: 0.87 mg/dL (ref 0.76–1.27)
EOS (ABSOLUTE): 0.1 10*3/uL (ref 0.0–0.4)
Eos: 2 %
Estimated CHD Risk: 0.6 times avg. (ref 0.0–1.0)
Free Thyroxine Index: 4 (ref 1.2–4.9)
GGT: 89 IU/L — ABNORMAL HIGH (ref 0–65)
Globulin, Total: 2.9 g/dL (ref 1.5–4.5)
Glucose: 113 mg/dL — ABNORMAL HIGH (ref 70–99)
HDL: 57 mg/dL (ref >39)
Hematocrit: 52.3 % — ABNORMAL HIGH (ref 37.5–51.0)
Hemoglobin: 18.3 g/dL — ABNORMAL HIGH (ref 13.0–17.7)
Immature Grans (Abs): 0 10*3/uL (ref 0.0–0.1)
Immature Granulocytes: 1 %
Iron: 146 ug/dL (ref 38–169)
LDH: 219 IU/L (ref 121–224)
LDL Chol Calc (NIH): 135 mg/dL — ABNORMAL HIGH (ref 0–99)
Lymphocytes Absolute: 2.1 10*3/uL (ref 0.7–3.1)
Lymphs: 39 %
MCH: 34 pg — ABNORMAL HIGH (ref 26.6–33.0)
MCHC: 35 g/dL (ref 31.5–35.7)
MCV: 97 fL (ref 79–97)
Monocytes Absolute: 0.3 10*3/uL (ref 0.1–0.9)
Monocytes: 5 %
Neutrophils Absolute: 2.8 10*3/uL (ref 1.4–7.0)
Neutrophils: 52 %
Phosphorus: 3 mg/dL (ref 2.8–4.1)
Platelets: 213 10*3/uL (ref 150–450)
Potassium: 4.4 mmol/L (ref 3.5–5.2)
RBC: 5.39 x10E6/uL (ref 4.14–5.80)
RDW: 12.4 % (ref 11.6–15.4)
Sodium: 141 mmol/L (ref 134–144)
T3 Uptake Ratio: 39 % (ref 24–39)
T4, Total: 10.3 ug/dL (ref 4.5–12.0)
TSH: 2.59 u[IU]/mL (ref 0.450–4.500)
Total Protein: 7.3 g/dL (ref 6.0–8.5)
Triglycerides: 101 mg/dL (ref 0–149)
Uric Acid: 5.4 mg/dL (ref 3.8–8.4)
VLDL Cholesterol Cal: 18 mg/dL (ref 5–40)
WBC: 5.3 10*3/uL (ref 3.4–10.8)
eGFR: 113 mL/min/{1.73_m2} (ref >59)

## 2023-05-11 ENCOUNTER — Encounter: Payer: 59 | Admitting: Emergency Medicine

## 2023-06-27 ENCOUNTER — Encounter: Payer: 59 | Admitting: Physician Assistant

## 2023-06-29 ENCOUNTER — Encounter: Payer: 59 | Admitting: Physician Assistant

## 2023-06-29 DIAGNOSIS — Z0000 Encounter for general adult medical examination without abnormal findings: Secondary | ICD-10-CM

## 2023-07-26 ENCOUNTER — Other Ambulatory Visit: Payer: Self-pay

## 2023-07-26 DIAGNOSIS — Z1339 Encounter for screening examination for other mental health and behavioral disorders: Secondary | ICD-10-CM

## 2023-07-26 NOTE — Progress Notes (Signed)
Presents to [facility] for random alcohol screen.  Random drug screen already completed at [facility], but they didn't do the alcohol screen [facility] requested.  Breath Alcohol Results = 0.000  AMD

## 2023-11-07 ENCOUNTER — Ambulatory Visit: Payer: Self-pay | Admitting: Physician Assistant

## 2023-11-07 ENCOUNTER — Encounter: Payer: Self-pay | Admitting: Physician Assistant

## 2023-11-07 VITALS — BP 129/75 | HR 69 | Temp 97.6°F | Resp 18 | Ht 73.0 in | Wt 290.0 lb

## 2023-11-07 DIAGNOSIS — R21 Rash and other nonspecific skin eruption: Secondary | ICD-10-CM

## 2023-11-07 MED ORDER — METHYLPREDNISOLONE SODIUM SUCC 40 MG IJ SOLR
40.0000 mg | Freq: Once | INTRAMUSCULAR | Status: AC
  Administered 2023-11-07: 40 mg via INTRAMUSCULAR

## 2023-11-07 MED ORDER — LIDOCAINE 5 % EX PTCH: 1.0000 | MEDICATED_PATCH | CUTANEOUS | 0 refills | Status: AC

## 2023-11-07 MED ORDER — OXYCODONE-ACETAMINOPHEN 7.5-325 MG PO TABS: 1.0000 | ORAL_TABLET | Freq: Four times a day (QID) | ORAL | 0 refills | Status: AC | PRN

## 2023-11-07 MED ORDER — METHYLPREDNISOLONE 4 MG PO TBPK: ORAL_TABLET | ORAL | 0 refills | Status: AC
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
## 2023-11-07 NOTE — Progress Notes (Signed)
Pt stating he had a rash appear Friday evening and it burns, painful and feels like sand paper. William Simpson

## 2024-01-19 ENCOUNTER — Other Ambulatory Visit: Payer: Self-pay

## 2024-01-19 DIAGNOSIS — Z20818 Contact with and (suspected) exposure to other bacterial communicable diseases: Secondary | ICD-10-CM

## 2024-01-19 DIAGNOSIS — R053 Chronic cough: Secondary | ICD-10-CM

## 2024-01-19 DIAGNOSIS — J322 Chronic ethmoidal sinusitis: Secondary | ICD-10-CM

## 2024-01-19 LAB — POCT RAPID STREP A (OFFICE): Rapid Strep A Screen: NEGATIVE

## 2024-01-19 LAB — POC COVID19 BINAXNOW: SARS Coronavirus 2 Ag: NEGATIVE

## 2024-01-19 LAB — POCT INFLUENZA A/B
Influenza A, POC: NEGATIVE
Influenza B, POC: NEGATIVE

## 2024-01-19 NOTE — Progress Notes (Signed)
 Stated several days of generalized not feeling well with sinusis, dry cough, sore throat and a child with strept throat with feeling of fever, but didn't check temp at home he said.  Taking PO adequate reported and stated continues to work.  Tested negative for covid/flu/strept reported to pt and OTC discussed for s/s and will call if s/s worsen or provider requested.

## 2024-05-14 ENCOUNTER — Ambulatory Visit: Payer: Self-pay

## 2024-05-14 DIAGNOSIS — Z0000 Encounter for general adult medical examination without abnormal findings: Secondary | ICD-10-CM

## 2024-05-14 LAB — POCT URINALYSIS DIPSTICK
Bilirubin, UA: NEGATIVE
Blood, UA: NEGATIVE
Glucose, UA: NEGATIVE
Ketones, UA: NEGATIVE
Leukocytes, UA: NEGATIVE
Nitrite, UA: NEGATIVE
Protein, UA: POSITIVE — AB
Spec Grav, UA: 1.025 (ref 1.010–1.025)
Urobilinogen, UA: 0.2 U/dL
pH, UA: 6 (ref 5.0–8.0)

## 2024-05-15 LAB — CMP12+LP+TP+TSH+6AC+PSA+CBC…
ALT: 91 IU/L — ABNORMAL HIGH (ref 0–44)
AST: 56 IU/L — ABNORMAL HIGH (ref 0–40)
Albumin: 4.5 g/dL (ref 4.1–5.1)
Alkaline Phosphatase: 43 IU/L — ABNORMAL LOW (ref 44–121)
BUN/Creatinine Ratio: 9 (ref 9–20)
BUN: 7 mg/dL (ref 6–20)
Basophils Absolute: 0 10*3/uL (ref 0.0–0.2)
Basos: 1 %
Bilirubin Total: 0.6 mg/dL (ref 0.0–1.2)
Calcium: 9.3 mg/dL (ref 8.7–10.2)
Chloride: 101 mmol/L (ref 96–106)
Chol/HDL Ratio: 3.3 ratio (ref 0.0–5.0)
Cholesterol, Total: 212 mg/dL — ABNORMAL HIGH (ref 100–199)
Creatinine, Ser: 0.74 mg/dL — ABNORMAL LOW (ref 0.76–1.27)
EOS (ABSOLUTE): 0.1 10*3/uL (ref 0.0–0.4)
Eos: 2 %
Estimated CHD Risk: <0.5 times avg. (ref 0.0–1.0)
Free Thyroxine Index: 2.4 (ref 1.2–4.9)
GGT: 82 IU/L — ABNORMAL HIGH (ref 0–65)
Globulin, Total: 2.5 g/dL (ref 1.5–4.5)
Glucose: 103 mg/dL — ABNORMAL HIGH (ref 70–99)
HDL: 64 mg/dL (ref >39)
Hematocrit: 50.4 % (ref 37.5–51.0)
Hemoglobin: 17.3 g/dL (ref 13.0–17.7)
Immature Grans (Abs): 0 10*3/uL (ref 0.0–0.1)
Immature Granulocytes: 0 %
Iron: 148 ug/dL (ref 38–169)
LDH: 210 IU/L (ref 121–224)
LDL Chol Calc (NIH): 132 mg/dL — ABNORMAL HIGH (ref 0–99)
Lymphocytes Absolute: 2 10*3/uL (ref 0.7–3.1)
Lymphs: 41 %
MCH: 34 pg — ABNORMAL HIGH (ref 26.6–33.0)
MCHC: 34.3 g/dL (ref 31.5–35.7)
MCV: 99 fL — ABNORMAL HIGH (ref 79–97)
Monocytes Absolute: 0.3 10*3/uL (ref 0.1–0.9)
Monocytes: 6 %
Neutrophils Absolute: 2.4 10*3/uL (ref 1.4–7.0)
Neutrophils: 49 %
Phosphorus: 3 mg/dL (ref 2.8–4.1)
Platelets: 190 10*3/uL (ref 150–450)
Potassium: 4.4 mmol/L (ref 3.5–5.2)
Prostate Specific Ag, Serum: 0.3 ng/mL (ref 0.0–4.0)
RBC: 5.09 x10E6/uL (ref 4.14–5.80)
RDW: 12.6 % (ref 11.6–15.4)
Sodium: 142 mmol/L (ref 134–144)
T3 Uptake Ratio: 30 % (ref 24–39)
T4, Total: 8.1 ug/dL (ref 4.5–12.0)
TSH: 1.57 u[IU]/mL (ref 0.450–4.500)
Total Protein: 7 g/dL (ref 6.0–8.5)
Triglycerides: 92 mg/dL (ref 0–149)
Uric Acid: 5.1 mg/dL (ref 3.8–8.4)
VLDL Cholesterol Cal: 16 mg/dL (ref 5–40)
WBC: 4.8 10*3/uL (ref 3.4–10.8)
eGFR: 118 mL/min/{1.73_m2} (ref >59)

## 2024-05-17 ENCOUNTER — Encounter: Admitting: Physician Assistant

## 2024-05-21 ENCOUNTER — Ambulatory Visit: Payer: Self-pay | Admitting: Student

## 2024-05-21 ENCOUNTER — Encounter

## 2024-05-21 VITALS — BP 148/94 | HR 106 | Resp 16 | Ht 73.0 in | Wt 290.0 lb

## 2024-05-21 DIAGNOSIS — L989 Disorder of the skin and subcutaneous tissue, unspecified: Secondary | ICD-10-CM

## 2024-05-21 DIAGNOSIS — Z0000 Encounter for general adult medical examination without abnormal findings: Secondary | ICD-10-CM

## 2024-05-21 NOTE — Progress Notes (Signed)
 Pt presents today to complete physical, Pt has small spot on left wrist x couple of years that he's wanting evaluated.
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
## 2024-05-22 NOTE — Addendum Note (Signed)
 Addended by: ELUTERIO JENKINS HERO on: 05/22/2024 10:10 AM   Modules accepted: Orders
